# Patient Record
Sex: Female | Born: 1995 | Race: White | Hispanic: No | Marital: Single | Smoking: Never smoker
Health system: Southern US, Community
[De-identification: ages and names within clinical notes are randomized; demographics above are authoritative.]

## PROBLEM LIST (undated history)

## (undated) DIAGNOSIS — G809 Cerebral palsy, unspecified: Secondary | ICD-10-CM

## (undated) DIAGNOSIS — F419 Anxiety disorder, unspecified: Secondary | ICD-10-CM

## (undated) DIAGNOSIS — N946 Dysmenorrhea, unspecified: Secondary | ICD-10-CM

## (undated) DIAGNOSIS — Z87442 Personal history of urinary calculi: Secondary | ICD-10-CM

## (undated) DIAGNOSIS — Z9889 Other specified postprocedural states: Secondary | ICD-10-CM

## (undated) HISTORY — DX: Dysmenorrhea, unspecified: N94.6

## (undated) HISTORY — PX: ADDUCTOR RELEASE: SHX1126

## (undated) HISTORY — PX: OTHER SURGICAL HISTORY: SHX169

## (undated) HISTORY — PX: HAMSTRING LENGTHENING: SHX1722

## (undated) HISTORY — DX: Cerebral palsy, unspecified: G80.9

## (undated) HISTORY — DX: Anxiety disorder, unspecified: F41.9

---
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
## 2017-02-07 ENCOUNTER — Other Ambulatory Visit: Payer: Self-pay | Admitting: Physician Assistant

## 2017-02-07 ENCOUNTER — Ambulatory Visit
Admission: RE | Admit: 2017-02-07 | Discharge: 2017-02-07 | Disposition: A | Discharge status: Home / Self Care | Payer: Medicaid Other | Source: Ambulatory Visit | Attending: Physician Assistant | Admitting: Physician Assistant

## 2017-02-07 DIAGNOSIS — R059 Cough, unspecified: Secondary | ICD-10-CM

## 2017-02-07 DIAGNOSIS — R05 Cough: Secondary | ICD-10-CM

## 2019-03-08 ENCOUNTER — Other Ambulatory Visit: Payer: Self-pay

## 2019-03-08 DIAGNOSIS — Z20822 Contact with and (suspected) exposure to covid-19: Secondary | ICD-10-CM

## 2019-03-10 LAB — NOVEL CORONAVIRUS, NAA: SARS-CoV-2, NAA: NOT DETECTED

## 2019-05-27 ENCOUNTER — Encounter: Payer: Self-pay | Admitting: Physical Medicine and Rehabilitation

## 2019-06-18 ENCOUNTER — Encounter
Payer: Medicaid Other | Attending: Physical Medicine and Rehabilitation | Admitting: Physical Medicine and Rehabilitation

## 2019-06-18 ENCOUNTER — Encounter: Payer: Self-pay | Admitting: Physical Medicine and Rehabilitation

## 2019-06-18 ENCOUNTER — Other Ambulatory Visit: Payer: Self-pay

## 2019-06-18 VITALS — BP 123/84 | HR 100 | Temp 98.1°F | Wt 80.0 lb

## 2019-06-18 DIAGNOSIS — Z993 Dependence on wheelchair: Secondary | ICD-10-CM | POA: Diagnosis present

## 2019-06-18 DIAGNOSIS — M7918 Myalgia, other site: Secondary | ICD-10-CM | POA: Diagnosis present

## 2019-06-18 DIAGNOSIS — G808 Other cerebral palsy: Secondary | ICD-10-CM | POA: Diagnosis present

## 2019-06-18 DIAGNOSIS — M898X1 Other specified disorders of bone, shoulder: Secondary | ICD-10-CM | POA: Insufficient documentation

## 2019-06-18 MED ORDER — DANTROLENE SODIUM 25 MG PO CAPS: 25.0000 mg | ORAL_CAPSULE | Freq: Every day | ORAL | 5 refills | Status: DC

## 2019-06-18 NOTE — Progress Notes (Signed)
Subjective:    Patient ID: Melissa Wagner, female    DOB: [birth year], 24 y.o.   MRN: [id number]  HPI   Patient is a 24 yr old R handed female with CP- spastic diplegia and has generalized anxiety disorder- On Sertraline for anxiety    needs at least 2 people in room to help if needs injections.    Hx of hamstring lengthening at age 9 Dorsal Rhizotomy at age 10.   Dr [facility] at [name]- that how got referral Thinks needs more hamstring tightening, but doesn's have time for casts x 10 weeks.    Pain- primary pain in R shoulder around scapula- constant 8/10 - on bad day- real bad day 10/10; 15/10 when can't get out of bed. Uses R hand to drive, write, and do everything (L hand can grab things, but more dominant R sided, but more spasticity on R) Doesn't trust L hand to not drop things.   Doesn't hurt more with hand on joystick.  Hurts the most when using laptop-  Has back support to keep better posture- to keep shoulder back so shoulder doesn't get so tight Since doesn't have core strength to maintain best posture.  Had since middle school And has chronic Has from muscle tension since elementary school as well.  Couldn't go tennis balls in the right place.  Sometimes pain can be B/L! When bad  Only tried extra tylenol- not sure if does much but puts her to sleep.  Also having issues with adductors B/L- feel stiff- tight. Has tried Lidocaine patches- didn't work.  PCP - 70-80 lbs and 33ft 3inches    Got COVID injection 1st dose Has a deathly bad allergic reaction to BACLOFEN!!!   Fatique Feels like body doesn't relax when she sleeps- holds tension/tightness everywhere.  Tight all the time- goes to bed at night/wakes up in AM and feels like runs marathon.  Feels doesn't get good sleep. Worse by anxiety.   Tried: Melatonin- gives her heartburn; gummies of melatonin and camommile/passflower- works to get her asleep- doesn't keep asleep. Never tried any other  sleep meds GP conservative with meds Never tried any other spasticity agents   Hx of Botox injection in legs- built up tolerance and didn't work anymore.  Stopped wearing AFOs at age 43 since custom AFOs stopped being covered by insurance.  Stopped wearing them because off the shelf AFOs hurt.  Can bathe and dress self, but takes increased time- shower takes 6 transfers.  Social Hx: Is college student  Pain Inventory Average Pain 6 Pain Right Now 8 My pain is constant, burning, aching and tightness  In the last 24 hours, has pain interfered with the following? General activity 7 Relation with others 4 Enjoyment of life 6 What TIME of day is your pain at its worst? daytime Sleep (in general) Fair  Pain is worse with: unsure Pain improves with: rest and medication Relief from Meds: 5  Mobility how many minutes can you walk? unknown ability to climb steps?  no do you drive?  no use a wheelchair transfers alone  Function what is your job? student disabled: date disabled . I need assistance with the following:  meal prep, household duties and shopping  Neuro/Psych bladder control problems bowel control problems anxiety  Prior Studies Any changes since last visit?  no  Physicians involved in your care Any changes since last visit?  no Primary care Melissa Wagner   No family history on file.  Social History   Socioeconomic History  . Marital status: Single    Spouse name: Not on file  . Number of children: Not on file  . Years of education: Not on file  . Highest education level: Not on file  Occupational History  . Not on file  Tobacco Use  . Smoking status: Not on file  Substance and Sexual Activity  . Alcohol use: Not on file  . Drug use: Not on file  . Sexual activity: Not on file  Other Topics Concern  . Not on file  Social History Narrative  . Not on file   Social Determinants of Health   Financial Resource  Strain:   . Difficulty of Paying Living Expenses:   Food Insecurity:   . Worried About Programme researcher, broadcasting/film/video in the Last Year:   . Barista in the Last Year:   Transportation Needs:   . Freight forwarder (Medical):   Marland Kitchen Lack of Transportation (Non-Medical):   Physical Activity:   . Days of Exercise per Week:   . Minutes of Exercise per Session:   Stress:   . Feeling of Stress :   Social Connections:   . Frequency of Communication with Friends and Family:   . Frequency of Social Gatherings with Friends and Family:   . Attends Religious Services:   . Active Member of Clubs or Organizations:   . Attends Banker Meetings:   Marland Kitchen Marital Status:    Past Surgical History:  Procedure Laterality Date  . ADDUCTOR RELEASE     at age 37  . dorsal rhizotomy     at age 30  . HAMSTRING LENGTHENING     at the age 18   Past Medical History:  Diagnosis Date  . Anxiety   . Cerebral palsy (HCC)   . Dysmenorrhea    BP 123/84   Pulse 100   Temp 98.1 F (36.7 C)   Wt 80 lb (36.3 kg) Comment: in wheelchair  SpO2 97%   Opioid Risk Score:   Fall Risk Score:  `1  Depression screen PHQ 2/9  Depression screen PHQ 2/9 06/18/2019  Decreased Interest 0  Down, Depressed, Hopeless 0  PHQ - 2 Score 0  Altered sleeping 3  Tired, decreased energy 2  Change in appetite 0  Feeling bad or failure about yourself  1  Trouble concentrating 2  Moving slowly or fidgety/restless 2  Suicidal thoughts 0  PHQ-9 Score 10  Difficult doing work/chores Somewhat difficult   Review of Systems  Gastrointestinal: Positive for constipation.  Psychiatric/Behavioral: The patient is nervous/anxious.   All other systems reviewed and are negative.      Objective:   Physical Exam  Awake, alert, petite, thin, in power w/c, power w/c, R joystick, NAD  MS:  Can lift shoulders to >90 degrees/flexion Lacking L elbow ROM -5-10 degrees Full ROM of R elbow Hyperextension of MCPs and slightly at  PIP not at DIPs Hyperextend thumbs at well Lacking ~ 35-40 degrees R knee extension and L knee extension lacking 20-30 degrees  Curls toes at rest; 2nd toes pushed down and curls underneath big toe B/L R>>>L rhomboid tightness; radiates up to  Upper trap and pec and scalenes and splenius capitus- so tight difficult to do ROM of R shoulder/scapulae.  MAS of 3 in LEs and 2 in LUE       Assessment & Plan:  Patient is a 24 yr old female with diplegic CP  and generalized anxiety disorder w/c dependent with significant spasticity and R shoulder/scapulae pain and fatigue  1. ALLERGIC TO BACLOFEN  2.  Theracane- [employer]- usually $30- hold firm pressure 2-4 minutes on muscles that are tight- use youtube-theracane for videos how ot use.   3. Wait for 1-2 weeks before starting- Magnesium 400 mg- 1-3x/day - only side effect is looser stools.  Take 1 tab 400 mg x 1 week then 2 tabs/800 mg and can if possible, go to 3 tabs.  4. Start first- Dantrolene- 25 mg nightly x- 1 month;  Then 50 mg nightly x 1 months then 75 mg nightly- for spasticity - can titrate up faster- as fast as every week.    5. In future, will discuss arm bike for exercise.   6. Will continue to increase Dantrolene until we get a good mix of spasticity control but not reducing strength.   7.  Will schedule appointment for trigger point injections.   8. F/U in 1- 2 weeks for injections and will see her in 6 more weeks for f/u. Double visit.   Spent a total of 80 minutes on appointment- pt had appropriately, lots of questions.

## 2019-06-18 NOTE — Patient Instructions (Signed)
Patient is a 24 yr old female with diplegic CP and generalized anxiety disorder w/c dependent with significant spasticity and R shoulder/scapulae pain and fatigue  1. ALLERGIC TO BACLOFEN  2.  Theracane- [employer]- usually $30- hold firm pressure 2-4 minutes on muscles that are tight- use youtube-theracane for videos how ot use.   3. Wait for 1-2 weeks before starting- Magnesium 400 mg- 1-3x/day - only side effect is looser stools.  Take 1 tab 400 mg x 1 week then 2 tabs/800 mg and can if possible, go to 3 tabs.  4. Start first- Dantrolene- 25 mg nightly x- 1 month;  Then 50 mg nightly x 1 months then 75 mg nightly- for spasticity - can titrate up faster- as fast as every week. Will do LFTs/CMP at next visit. Easier to use R outside vein in elbow   5. In future, will discuss arm bike for exercise.   6. Will continue to increase Dantrolene until we get a good mix of spasticity control but not reducing strength.   7.  Will schedule appointment for trigger point injections.   8. F/U in 1- 2 weeks for injections

## 2019-06-30 ENCOUNTER — Telehealth: Payer: Self-pay | Admitting: *Deleted

## 2019-06-30 NOTE — Telephone Encounter (Signed)
Prior authorization submitted for dantrolene  Approved 06/29/2019 - 06/23/2020

## 2019-07-07 ENCOUNTER — Other Ambulatory Visit: Payer: Self-pay

## 2019-07-07 ENCOUNTER — Encounter: Payer: Self-pay | Admitting: Physical Medicine and Rehabilitation

## 2019-07-07 ENCOUNTER — Encounter
Payer: Medicaid Other | Attending: Physical Medicine and Rehabilitation | Admitting: Physical Medicine and Rehabilitation

## 2019-07-07 VITALS — BP 126/86 | HR 98 | Temp 97.5°F

## 2019-07-07 DIAGNOSIS — Z993 Dependence on wheelchair: Secondary | ICD-10-CM | POA: Diagnosis present

## 2019-07-07 DIAGNOSIS — R6884 Jaw pain: Secondary | ICD-10-CM | POA: Diagnosis not present

## 2019-07-07 DIAGNOSIS — M7918 Myalgia, other site: Secondary | ICD-10-CM | POA: Diagnosis present

## 2019-07-07 DIAGNOSIS — M898X1 Other specified disorders of bone, shoulder: Secondary | ICD-10-CM | POA: Insufficient documentation

## 2019-07-07 DIAGNOSIS — G808 Other cerebral palsy: Secondary | ICD-10-CM

## 2019-07-07 NOTE — Progress Notes (Deleted)
Subjective:    Patient ID: Melissa Wagner, female    DOB: 1996/03/04, 24 y.o.   MRN: [id number]  HPI  Pain Inventory Average Pain 6 Pain Right Now 4 My pain is na  In the last 24 hours, has pain interfered with the following? General activity 7 Relation with others 4 Enjoyment of life 7 What TIME of day is your pain at its worst? evening Sleep (in general) Fair  Pain is worse with: unsure Pain improves with: rest and medication Relief from Meds: na  Mobility use a wheelchair transfers alone  Function disabled: date disabled Oct 22, 1995  Neuro/Psych bladder control problems bowel control problems anxiety  Prior Studies Any changes since last visit?  no  Physicians involved in your care Any changes since last visit?  no   Family History  Problem Relation Age of Onset  . Hypertension Mother   . Hyperlipidemia Mother   . Obesity Mother   . Hypothyroidism Mother   . Hypertension Father   . Hyperlipidemia Father    Social History   Socioeconomic History  . Marital status: Single    Spouse name: Not on file  . Number of children: Not on file  . Years of education: Not on file  . Highest education level: Not on file  Occupational History  . Not on file  Tobacco Use  . Smoking status: Never Smoker  . Smokeless tobacco: Never Used  Substance and Sexual Activity  . Alcohol use: Never  . Drug use: Never  . Sexual activity: Not on file  Other Topics Concern  . Not on file  Social History Narrative  . Not on file   Social Determinants of Health   Financial Resource Strain:   . Difficulty of Paying Living Expenses:   Food Insecurity:   . Worried About Programme researcher, broadcasting/film/video in the Last Year:   . Barista in the Last Year:   Transportation Needs:   . Freight forwarder (Medical):   Marland Kitchen Lack of Transportation (Non-Medical):   Physical Activity:   . Days of Exercise per Week:   . Minutes of Exercise per Session:   Stress:   . Feeling of Stress  :   Social Connections:   . Frequency of Communication with Friends and Family:   . Frequency of Social Gatherings with Friends and Family:   . Attends Religious Services:   . Active Member of Clubs or Organizations:   . Attends Banker Meetings:   Marland Kitchen Marital Status:    Past Surgical History:  Procedure Laterality Date  . ADDUCTOR RELEASE     at age 42  . dorsal rhizotomy     at age 17  . HAMSTRING LENGTHENING     at the age 47   Past Medical History:  Diagnosis Date  . Anxiety   . Cerebral palsy (HCC)   . Dysmenorrhea    BP 126/86   Pulse 98   Temp (!) 97.5 F (36.4 C)   SpO2 98%   Opioid Risk Score:   Fall Risk Score:  `1  Depression screen PHQ 2/9  Depression screen PHQ 2/9 06/18/2019  Decreased Interest 0  Down, Depressed, Hopeless 0  PHQ - 2 Score 0  Altered sleeping 3  Tired, decreased energy 2  Change in appetite 0  Feeling bad or failure about yourself  1  Trouble concentrating 2  Moving slowly or fidgety/restless 2  Suicidal thoughts 0  PHQ-9 Score 10  Difficult doing work/chores Somewhat difficult    Review of Systems  Constitutional: Positive for appetite change.       Poor appetite  HENT: Negative.   Eyes: Negative.   Respiratory: Negative.   Cardiovascular: Negative.   Gastrointestinal: Positive for constipation.       Bowel control  Endocrine: Negative.   Genitourinary:       Bladder control  Musculoskeletal: Negative.   Skin: Negative.   Allergic/Immunologic: Negative.   Neurological: Negative.   Hematological: Negative.   Psychiatric/Behavioral: The patient is nervous/anxious.   All other systems reviewed and are negative.      Objective:   Physical Exam        Assessment & Plan:

## 2019-07-07 NOTE — Patient Instructions (Signed)
Patient here for trigger point injections for  Consent done and on chart.  Cleaned areas with alcohol and injected using a 27 gauge 1.5 inch needle  Injected  4.5cc Using 1% Lidocaine with no EPI  Upper traps Levators Posterior scalenes Middle scalenes Splenius Capitus Pectoralis Major Rhomboids On R 4x and ON L, 3 spots Infraspinatus Teres Major/minor Thoracic paraspinals Lumbar paraspinals Other injections-    Patient's level of pain prior was- 4/10 Current level of pain after injections is- upper back is 1-2/10- and can do more R shoulder extension- and low back muscles fatigued from position. And can now bend down further than has before too.  There was no bleeding or complications.  Patient was advised to drink a lot of water on day after injections to flush system Will have increased soreness for 12-48 hours after injections.  Can use Lidocaine patches the day AFTER injections Can use theracane on day of injections in places didn't inject Can use heating pad ice 4-6 hours AFTER injections   2. Will  Show pt how to do mouth release to relax muscles in jaw. Rolls down and up gumline and then hold muscle in and out of mouth for 3-5 minutes until relaxes.    3. Can start Dantrolene once insurance approves- then wait for 2 weeks for magnesium.  4. Asked about the CP fatigue. Discussed spoon theory.   5. Talks about melatonin- likely takes 6 mg- allowed to take up to 9 mg (3 pills/gummies)  6.   F/U 6 weeks

## 2019-07-07 NOTE — Progress Notes (Signed)
  Hasn't started Dantrolene- because pharmacy hasn't filled; insurance hasn't approved yet.   Doesn't need LFTs yet.   Hasn't tried Magnesium yet.  Since was to start Magnesium after dantrolene.   Showers are the most fatiguing thing she does every day.   Discussed spoon theory for energy- since c/o fatigue.  Still not sleeping well.   Describing muscle of L foot- pulling towards her- very painful.    Patient here for trigger point injections for  Consent done and on chart.  Cleaned areas with alcohol and injected using a 27 gauge 1.5 inch needle  Injected  4.5cc Using 1% Lidocaine with no EPI  Upper traps Levators Posterior scalenes Middle scalenes Splenius Capitus Pectoralis Major Rhomboids On R 4x and ON L, 3 spots Infraspinatus Teres Major/minor Thoracic paraspinals Lumbar paraspinals Other injections-    Patient's level of pain prior was- 4/10 Current level of pain after injections is- upper back is 1-2/10- and can do more R shoulder extension- and low back muscles fatigued from position. And can now bend down further than has before too.  There was no bleeding or complications.  Patient was advised to drink a lot of water on day after injections to flush system Will have increased soreness for 12-48 hours after injections.  Can use Lidocaine patches the day AFTER injections Can use theracane on day of injections in places didn't inject Can use heating pad ice 4-6 hours AFTER injections   2.  Showed pt how to do mouth release for jaw pain- to relax muscles in jaw. Rolls down and up gumline and then hold muscle in and out of mouth for 3-5 minutes until relaxes.    3. Can start Dantrolene- then wait for 2 weeks for magnesium.  4. Asked about the CP fatigue. Discussed spoon theory.   5. Talks about melatonin- likely takes 6 mg- allowed to take up to 9 mg (3 pills/gummies)  6.   F/U 6 weeks double appointment

## 2019-07-12 ENCOUNTER — Telehealth: Payer: Self-pay | Admitting: *Deleted

## 2019-07-12 NOTE — Telephone Encounter (Signed)
Ms Tukes called to report that she is able to get the dantrolene now but the directions on the bottle do not match what Dr Berline Chough told her as directions.  Bottle has the titrate up per week and she reports Dr Berline Chough said to take one daily for a month then go to two daily for the month and then three.  Please advise.

## 2019-07-13 NOTE — Telephone Encounter (Signed)
I wrote down for pt how FAST she CAN titrate up, if she feels comfortable and how slow SHE can TITRATE IF not tolerating Dantrolene as easily- I explained this to her, but she appears to not remember.   So, she can titrate up every week if she's tolerating the medicine, OR as SLOW as increase every month, if she's having difficulty getting used to the medicine.   Thanks, ML

## 2019-07-15 NOTE — Telephone Encounter (Signed)
Notified Melissa Wagner. She is not comfortable increasing on weekly basis.  She will increase monthly.

## 2019-07-30 ENCOUNTER — Encounter: Payer: Medicaid Other | Admitting: Physical Medicine and Rehabilitation

## 2019-08-02 ENCOUNTER — Encounter
Payer: Medicaid Other | Attending: Physical Medicine and Rehabilitation | Admitting: Physical Medicine and Rehabilitation

## 2019-08-02 ENCOUNTER — Other Ambulatory Visit: Payer: Self-pay

## 2019-08-02 ENCOUNTER — Encounter: Payer: Self-pay | Admitting: Physical Medicine and Rehabilitation

## 2019-08-02 VITALS — BP 129/86 | HR 106

## 2019-08-02 DIAGNOSIS — G808 Other cerebral palsy: Secondary | ICD-10-CM | POA: Diagnosis not present

## 2019-08-02 DIAGNOSIS — M898X1 Other specified disorders of bone, shoulder: Secondary | ICD-10-CM | POA: Insufficient documentation

## 2019-08-02 DIAGNOSIS — M7918 Myalgia, other site: Secondary | ICD-10-CM | POA: Diagnosis not present

## 2019-08-02 DIAGNOSIS — Z993 Dependence on wheelchair: Secondary | ICD-10-CM | POA: Diagnosis not present

## 2019-08-02 NOTE — Progress Notes (Signed)
Patient is a 24 yr old female with diplegic CP and generalized anxiety disorder w/c dependent with significant spasticity and R shoulder/scapulae pain and fatigue  Is on Dantrolene 50 mg QHS moved up to after 1 week on Dantrolene 25 mg QHS. Tolerating - no side effects. Harder than normal to get up in the morning.    Don't break a sweat on R foot -easier-  Toes are starting to be less tight   Here for trigger point injections R rhomboids- feels fatigued, after injection last time.  Doesn't know if they were helpful For 24 hours- great ROM Then had HA's afterward Then muscles clamped even tighter.   Only had to wear back brace once since last time.  Anxiety going into freak out mode.  Probably had some improvement, since willing to do again.    Plan: 1. Be concerned if pulse >125-130.  2. Can increase Dantrolene to 75 mg nightly.   3. Might have to learn how to do tasks differently with less tone.   4. Patient here for trigger point injections for myofascial pain/trigger points   Consent done and on chart.  Cleaned areas with alcohol and injected using a 27 gauge 1.5 inch needle  Injected 2cc Using 1% Lidocaine with no EPI  Upper traps Levators Posterior scalenes Middle scalenes Splenius Capitus B/L Pectoralis Major Rhomboids B/L x2 Infraspinatus Teres Major/minor Thoracic paraspinals B/L x2 Lumbar paraspinals Other injections-    Patient's level of pain prior was- 6/10 Current level of pain after injections is- same-   There was no bleeding or complications.  Patient was advised to drink a lot of water on day after injections to flush system Will have increased soreness for 12-48 hours after injections.  Can use Lidocaine patches the day AFTER injections Can use theracane on day of injections in places didn't inject Can use heating pad 4-6 hours AFTER injections  5. Pain diary- for next few weeks.  Not lazy by laying down multiple times per day.      6. F/U- 6 weeks.

## 2019-08-02 NOTE — Patient Instructions (Signed)
Plan: 1. Be concerned if pulse >125-130.  2. Can increase Dantrolene to 75 mg nightly.   3. Might have to learn how to do tasks differently with less tone.   4. Patient here for trigger point injections for myofascial pain/trigger points   Consent done and on chart.  Cleaned areas with alcohol and injected using a 27 gauge 1.5 inch needle  Injected 2cc Using 1% Lidocaine with no EPI  Upper traps Levators Posterior scalenes Middle scalenes Splenius Capitus B/L Pectoralis Major Rhomboids B/L x2 Infraspinatus Teres Major/minor Thoracic paraspinals B/L x2 Lumbar paraspinals Other injections-    Patient's level of pain prior was- 6/10 Current level of pain after injections is- same-   There was no bleeding or complications.  Patient was advised to drink a lot of water on day after injections to flush system Will have increased soreness for 12-48 hours after injections.  Can use Lidocaine patches the day AFTER injections Can use theracane on day of injections in places didn't inject Can use heating pad 4-6 hours AFTER injections  5. Pain diary- for next few weeks.  Not lazy by laying down multiple times per day.     6. F/U- 6 weeks.

## 2019-08-10 DIAGNOSIS — G800 Spastic quadriplegic cerebral palsy: Secondary | ICD-10-CM

## 2019-08-17 ENCOUNTER — Telehealth: Payer: Self-pay

## 2019-08-17 MED ORDER — DANTROLENE SODIUM 25 MG PO CAPS: 25.0000 mg | ORAL_CAPSULE | Freq: Every day | ORAL | 5 refills | Status: DC

## 2019-08-17 NOTE — Telephone Encounter (Signed)
Patient called stating she only has 4 days left of her Dantrolene and she called the pharmacy but they said to call Dr. Berline Chough office. In reviewing her chart. The first prescription was a titrate up. I called and gave [facility] a new script with new directions.

## 2019-09-13 ENCOUNTER — Encounter
Payer: Medicaid Other | Attending: Physical Medicine and Rehabilitation | Admitting: Physical Medicine and Rehabilitation

## 2019-09-13 ENCOUNTER — Encounter: Payer: Self-pay | Admitting: Physical Medicine and Rehabilitation

## 2019-09-13 ENCOUNTER — Other Ambulatory Visit: Payer: Self-pay

## 2019-09-13 VITALS — BP 132/86 | HR 88

## 2019-09-13 DIAGNOSIS — M898X1 Other specified disorders of bone, shoulder: Secondary | ICD-10-CM | POA: Diagnosis not present

## 2019-09-13 DIAGNOSIS — G808 Other cerebral palsy: Secondary | ICD-10-CM | POA: Diagnosis not present

## 2019-09-13 DIAGNOSIS — Z993 Dependence on wheelchair: Secondary | ICD-10-CM | POA: Insufficient documentation

## 2019-09-13 DIAGNOSIS — M7918 Myalgia, other site: Secondary | ICD-10-CM | POA: Diagnosis present

## 2019-09-13 DIAGNOSIS — R252 Cramp and spasm: Secondary | ICD-10-CM | POA: Insufficient documentation

## 2019-09-13 MED ORDER — DANTROLENE SODIUM 100 MG PO CAPS: 100.0000 mg | ORAL_CAPSULE | Freq: Every day | ORAL | 5 refills | Status: DC

## 2019-09-13 NOTE — Progress Notes (Signed)
Subjective:    Patient ID: Melissa Wagner, female    DOB: 1995/07/25, 24 y.o.   MRN: [id number]  HPI  Patient is a 24 yr old R handed female with CP- spastic diplegia and has generalized anxiety disorder- On Sertraline for anxiety  Can actually see toes -all 5 toes now- first time in years.    Has transferred home out of dorm Has more hamstring ROM than before.    Spasticity On 75 mg of Dantrolene- helps with sleep-  Sleeping 5-6 hours consecutively.    2 Concerns Gained ~ 10-15 lbs Since January Never been able to gain weight - ever Can still transfer self- no issues.  Thinks a lot of muscle Strength has gotten better.  Balance feels a little off.  .  Pain diary didn't go well Semester ended 3 days after I saw her last.   Trigger point injections On back of neck were fantastic And R rhomboids used to be like a [place] breast, was so swollen- now better.   Small of back "wringing them out like a wet washcloth" ~ 4x/week on average.  When sitting with hips forward.    Before when had hamstring surgery- was open- needed to move foot backwards- was able to then, not able to now-  Was wondering if should have the closed procedure again.    Hasn't changed diet and having more constipation.        Pain Inventory Average Pain 5 Pain Right Now 5 My pain is constant  In the last 24 hours, has pain interfered with the following? General activity n/a Relation with others n/a Enjoyment of life n/a What TIME of day is your pain at its worst? anytime Sleep (in general) Fair  Pain is worse with: unsure Pain improves with: injections Relief from Meds: no pain meds  Mobility use a wheelchair transfers alone  Function not employed: date last employed .  Neuro/Psych tremor anxiety  Prior Studies Any changes since last visit?  no  Physicians involved in your care Any changes since last visit?  no   Family History  Problem Relation Age of Onset  .  Hypertension Mother   . Hyperlipidemia Mother   . Obesity Mother   . Hypothyroidism Mother   . Hypertension Father   . Hyperlipidemia Father    Social History   Socioeconomic History  . Marital status: Single    Spouse name: Not on file  . Number of children: Not on file  . Years of education: Not on file  . Highest education level: Not on file  Occupational History  . Not on file  Tobacco Use  . Smoking status: Never Smoker  . Smokeless tobacco: Never Used  Vaping Use  . Vaping Use: Never used  Substance and Sexual Activity  . Alcohol use: Never  . Drug use: Never  . Sexual activity: Not on file  Other Topics Concern  . Not on file  Social History Narrative  . Not on file   Social Determinants of Health   Financial Resource Strain:   . Difficulty of Paying Living Expenses:   Food Insecurity:   . Worried About Charity fundraiser in the Last Year:   . Arboriculturist in the Last Year:   Transportation Needs:   . Film/video editor (Medical):   Marland Kitchen Lack of Transportation (Non-Medical):   Physical Activity:   . Days of Exercise per Week:   . Minutes of Exercise per Session:  Stress:   . Feeling of Stress :   Social Connections:   . Frequency of Communication with Friends and Family:   . Frequency of Social Gatherings with Friends and Family:   . Attends Religious Services:   . Active Member of Clubs or Organizations:   . Attends Banker Meetings:   Marland Kitchen Marital Status:    Past Surgical History:  Procedure Laterality Date  . ADDUCTOR RELEASE     at age 69  . dorsal rhizotomy     at age 68  . HAMSTRING LENGTHENING     at the age 63   Past Medical History:  Diagnosis Date  . Anxiety   . Cerebral palsy (HCC)   . Dysmenorrhea    BP 132/86   Pulse 88   SpO2 99%   Opioid Risk Score:   Fall Risk Score:  `1  Depression screen PHQ 2/9  Depression screen PHQ 2/9 06/18/2019  Decreased Interest 0  Down, Depressed, Hopeless 0  PHQ - 2 Score 0    Altered sleeping 3  Tired, decreased energy 2  Change in appetite 0  Feeling bad or failure about yourself  1  Trouble concentrating 2  Moving slowly or fidgety/restless 2  Suicidal thoughts 0  PHQ-9 Score 10  Difficult doing work/chores Somewhat difficult    Review of Systems  Constitutional: Negative.   HENT: Negative.   Eyes: Negative.   Respiratory: Negative.   Cardiovascular: Negative.   Gastrointestinal: Negative.   Endocrine: Negative.   Genitourinary: Negative.   Musculoskeletal: Negative.   Skin: Negative.   Allergic/Immunologic: Negative.   Neurological: Positive for tremors.  Psychiatric/Behavioral: The patient is nervous/anxious.   All other systems reviewed and are negative.      Objective:   Physical Exam  Pt sitting up in power w/c, joystick on R side; feet more settled on footplates  Able to extend knees better- L knee lacking only 15-20- was 30 degrees- and R knee ~ 20-25 degrees- was 40 degrees MAS of LEs is 2 today B/L     Assessment & Plan:   Patient is a 24 yr old R handed female with CP- spastic diplegia and has generalized anxiety disorder- On Sertraline for anxiety  1. Asked her to call [employer] about corset brace again,  to work on back pain and sitting upright.   2. Is getting good ROM of hamstrings per mother- and think we should focus on maximizing Dantrolene before look at surgery- can look at [facility] in future.   3. ALLERGIC TO BACLOFEN  4. Dantrolene- 100 mg nightly- call me/change to 50 mg 2x/day IF any concerns.   5. Can add magnesium if you want AFTER been on Dantrolene 100 mg for 1 week- suggest adding mainly for looser stools.   6. Patient here for trigger point injections for spasticity/myofascial pain  Consent done and on chart.  Cleaned areas with alcohol and injected using a 27 gauge 1.5 inch needle  Injected 2cc Using 1% Lidocaine with no EPI  Upper traps Levators Posterior scalenes Middle  scalenes Splenius Capitus Pectoralis Major Rhomboids- R rhomboids x 4- over large muscle spasm Infraspinatus Teres Major/minor Thoracic paraspinals Lumbar paraspinals Other injections- waited on upper traps-    Patient's level of pain prior was 4-6/10 Current level of pain after injections is 2/10  There was no bleeding or complications.  Patient was advised to drink a lot of water on day after injections to flush system Will have  increased soreness for 12-48 hours after injections.  Can use Lidocaine patches the day AFTER injections Can use theracane on day of injections in places didn't inject Can use heating pad 4-6 hours AFTER injections  7. F/U as already scheduled.  8. LFTs/CMP due to spasticity and on Dantrolene   I spent a total of 45 minutes on appointment- as detailed above.

## 2019-09-13 NOTE — Patient Instructions (Signed)
Patient is a 24 yr old R handed female with CP- spastic diplegia and has generalized anxiety disorder- On Sertraline for anxiety  1. Asked her to call [employer] about corset brace again,  to work on back pain and sitting upright.   2. Is getting good ROM of hamstrings per mother- and think we should focus on maximizing Dantrolene before look at surgery- can look at [facility] in future.   3. ALLERGIC TO BACLOFEN  4. Dantrolene- 100 mg nightly- call me/change to 50 mg 2x/day IF any concerns.   5. Can add magnesium if you want AFTER been on Dantrolene 100 mg for 1 week- suggest adding mainly for looser stools.   6. Patient here for trigger point injections for spasticity/myofascial pain  Consent done and on chart.  Cleaned areas with alcohol and injected using a 27 gauge 1.5 inch needle  Injected 2cc Using 1% Lidocaine with no EPI  Upper traps Levators Posterior scalenes Middle scalenes Splenius Capitus Pectoralis Major Rhomboids- R rhomboids x 4- over large muscle spasm Infraspinatus Teres Major/minor Thoracic paraspinals Lumbar paraspinals Other injections- waited on upper traps-    Patient's level of pain prior was 4-6/10 Current level of pain after injections is 2/10  There was no bleeding or complications.  Patient was advised to drink a lot of water on day after injections to flush system Will have increased soreness for 12-48 hours after injections.  Can use Lidocaine patches the day AFTER injections Can use theracane on day of injections in places didn't inject Can use heating pad 4-6 hours AFTER injections  7. F/U as already scheduled.  8. LFTs/CMP due to spasticity and on Dantrolene

## 2019-09-16 ENCOUNTER — Telehealth: Payer: Self-pay

## 2019-09-16 NOTE — Telephone Encounter (Addendum)
Prior authorization submitted via [place] [employer].  Approved 09/17/2019-09/12/2019  Patient notified

## 2019-09-21 ENCOUNTER — Telehealth: Payer: Self-pay

## 2019-09-21 NOTE — Telephone Encounter (Signed)
Last night Melissa Wagner missed her dose of Rx Dantrium 100 mg.   Should she take it now or wait until bedtime to take the medication?   Please advise.

## 2019-09-21 NOTE — Telephone Encounter (Signed)
Patient advise.  

## 2019-09-21 NOTE — Telephone Encounter (Signed)
Take tonight- and just chalk it up to missing one, unfortunately- if it's within 12 hours of scheduled dose, don't take- if it's only a few hours late, no problem.   Thanks, ML

## 2019-10-11 ENCOUNTER — Other Ambulatory Visit: Payer: Self-pay

## 2019-10-11 ENCOUNTER — Encounter
Payer: Medicaid Other | Attending: Physical Medicine and Rehabilitation | Admitting: Physical Medicine and Rehabilitation

## 2019-10-11 ENCOUNTER — Encounter: Payer: Self-pay | Admitting: Physical Medicine and Rehabilitation

## 2019-10-11 VITALS — BP 126/70 | HR 99 | Temp 98.9°F

## 2019-10-11 DIAGNOSIS — G808 Other cerebral palsy: Secondary | ICD-10-CM | POA: Diagnosis not present

## 2019-10-11 DIAGNOSIS — M7918 Myalgia, other site: Secondary | ICD-10-CM | POA: Insufficient documentation

## 2019-10-11 DIAGNOSIS — Z993 Dependence on wheelchair: Secondary | ICD-10-CM | POA: Insufficient documentation

## 2019-10-11 DIAGNOSIS — M898X1 Other specified disorders of bone, shoulder: Secondary | ICD-10-CM | POA: Insufficient documentation

## 2019-10-11 DIAGNOSIS — R252 Cramp and spasm: Secondary | ICD-10-CM | POA: Diagnosis not present

## 2019-10-11 MED ORDER — DANTROLENE SODIUM 50 MG PO CAPS: 150.0000 mg | ORAL_CAPSULE | Freq: Every day | ORAL | 5 refills | Status: DC

## 2019-10-11 NOTE — Patient Instructions (Signed)
Patient is a 24 yr old R handed female with CP- spastic diplegia and has generalized anxiety disorder- On Sertraline for anxiety here for f/u and trigger point injections.    1. Will wait to call in increase in Dantrolene- til Rx is ready to be changed- will increase to Dantrolene 150 mg nightly.  Also got LFTs this AM- will verify looks good.  Will send in, but cannot get til end of month.   2. Will call [employer] for pt-    3. Patient here for trigger point injections for  Consent done and on chart.  Cleaned areas with alcohol and injected using a 27 gauge 1.5 inch needle  Injected  3cc Using 1% Lidocaine with no EPI  Upper traps Levators Posterior scalenes R only Middle scalenes Splenius Capitus B/L Pectoralis Major Rhomboids On R rhomboid, did 4 injections Infraspinatus Teres Major/minor Thoracic paraspinals Lumbar paraspinals Other injections-    Patient's level of pain prior was 6/10 Current level of pain after injections is 3/10- with less muscle tightness on R rhomboids.   There was no bleeding or complications.  Patient was advised to drink a lot of water on day after injections to flush system Will have increased soreness for 12-48 hours after injections.  Can use Lidocaine patches the day AFTER injections Can use theracane on day of injections in places didn't inject Can use heating pad 4-6 hours AFTER injections  4. F/U in 4-6 weeks.

## 2019-10-11 NOTE — Progress Notes (Signed)
Subjective:    Patient ID: Melissa Wagner, female    DOB: [birth year], 24 y.o.   MRN: [id number]  HPI  Patient is a 24 yr old R handed female with CP- spastic diplegia and has generalized anxiety disorder- On Sertraline for anxiety here for f/u and trigger point injections.    Has tried to call [employer]- can't get a human- hasn't gotten a call back.   Went up on Dantrolene- not sure much change with 75 to 100 mg- NO SIDE EFFECTS  Has been so spastic and unmedicated her entire life.   Depends on the day if pain is better- the muscle in upper back doesn't get as tight anymore, but still gets some pain some days.   Knows has gotten more ROM with Dantrolene.   R adductor is really tight last few days- does this every now and then- doesn't think is due to growth spurts, which did when younger. Since 24 yrs old    Pain Inventory Average Pain 6 Pain Right Now 7 My pain is constant and burning  In the last 24 hours, has pain interfered with the following? General activity 7 Relation with others 0 Enjoyment of life 7 What TIME of day is your pain at its worst? varies Sleep (in general) Fair  Pain is worse with: unsure Pain improves with: injections Relief from Meds: no pain meds  Mobility use a wheelchair transfers alone  Function not employed: date last employed .  Neuro/Psych tremor anxiety  Prior Studies Any changes since last visit?  no  Physicians involved in your care Any changes since last visit?  no   Family History  Problem Relation Age of Onset  . Hypertension Mother   . Hyperlipidemia Mother   . Obesity Mother   . Hypothyroidism Mother   . Hypertension Father   . Hyperlipidemia Father    Social History   Socioeconomic History  . Marital status: Single    Spouse name: Not on file  . Number of children: Not on file  . Years of education: Not on file  . Highest education level: Not on file  Occupational History  . Not on file  Tobacco Use    . Smoking status: Never Smoker  . Smokeless tobacco: Never Used  Vaping Use  . Vaping Use: Never used  Substance and Sexual Activity  . Alcohol use: Never  . Drug use: Never  . Sexual activity: Not on file  Other Topics Concern  . Not on file  Social History Narrative  . Not on file   Social Determinants of Health   Financial Resource Strain:   . Difficulty of Paying Living Expenses:   Food Insecurity:   . Worried About Programme researcher, broadcasting/film/video in the Last Year:   . Barista in the Last Year:   Transportation Needs:   . Freight forwarder (Medical):   Marland Kitchen Lack of Transportation (Non-Medical):   Physical Activity:   . Days of Exercise per Week:   . Minutes of Exercise per Session:   Stress:   . Feeling of Stress :   Social Connections:   . Frequency of Communication with Friends and Family:   . Frequency of Social Gatherings with Friends and Family:   . Attends Religious Services:   . Active Member of Clubs or Organizations:   . Attends Banker Meetings:   Marland Kitchen Marital Status:    Past Surgical History:  Procedure Laterality Date  . ADDUCTOR  RELEASE     at age 22  . dorsal rhizotomy     at age 64  . HAMSTRING LENGTHENING     at the age 65   Past Medical History:  Diagnosis Date  . Anxiety   . Cerebral palsy (HCC)   . Dysmenorrhea    BP 126/70   Pulse 99   Temp 98.9 F (37.2 C)   SpO2 97%   Opioid Risk Score:   Fall Risk Score:  `1  Depression screen PHQ 2/9  Depression screen PHQ 2/9 06/18/2019  Decreased Interest 0  Down, Depressed, Hopeless 0  PHQ - 2 Score 0  Altered sleeping 3  Tired, decreased energy 2  Change in appetite 0  Feeling bad or failure about yourself  1  Trouble concentrating 2  Moving slowly or fidgety/restless 2  Suicidal thoughts 0  PHQ-9 Score 10  Difficult doing work/chores Somewhat difficult    Review of Systems  Neurological: Positive for tremors.  Psychiatric/Behavioral: The patient is nervous/anxious.    All other systems reviewed and are negative.      Objective:   Physical Exam Awake, alert, appropriate, in MANUAL w/c today since couldn't get here in power w/c - would have to come at 8am per bus., NAD Very tight in R rhomboids and infrascapular- - actually protruding out some, so tight.  MAS is 2-3 in hips esp R adductor MAS is 2-3 in knees- lacking  20-30 degrees on L; and ~ 30-45 degrees on R of knees.       Assessment & Plan:   Patient is a 24 yr old R handed female with CP- spastic diplegia and has generalized anxiety disorder- On Sertraline for anxiety here for f/u and trigger point injections.    1. Will wait to call in increase in Dantrolene- til Rx is ready to be changed- will increase to Dantrolene 150 mg nightly.  Also got LFTs this AM- will verify looks good.  Will send in, but cannot get til end of month.   2. Will call [employer] for pt-    3. Patient here for trigger point injections for  Consent done and on chart.  Cleaned areas with alcohol and injected using a 27 gauge 1.5 inch needle  Injected  3cc Using 1% Lidocaine with no EPI  Upper traps Levators Posterior scalenes R only Middle scalenes Splenius Capitus B/L Pectoralis Major Rhomboids On R rhomboid, did 4 injections Infraspinatus Teres Major/minor Thoracic paraspinals Lumbar paraspinals Other injections-    Patient's level of pain prior was 6/10 Current level of pain after injections is 3/10- with less muscle tightness on R rhomboids.   There was no bleeding or complications.  Patient was advised to drink a lot of water on day after injections to flush system Will have increased soreness for 12-48 hours after injections.  Can use Lidocaine patches the day AFTER injections Can use theracane on day of injections in places didn't inject Can use heating pad 4-6 hours AFTER injections  4. F/U in 4-6 weeks.   I spent a total of 30 minutes on visit- as detailed above.

## 2019-10-12 LAB — COMPREHENSIVE METABOLIC PANEL
ALT: 4 IU/L (ref 0–32)
AST: 8 IU/L (ref 0–40)
Albumin/Globulin Ratio: 1.6 (ref 1.2–2.2)
Albumin: 4.3 g/dL (ref 3.9–5.0)
Alkaline Phosphatase: 45 IU/L — ABNORMAL LOW (ref 48–121)
BUN/Creatinine Ratio: 11 (ref 9–23)
BUN: 7 mg/dL (ref 6–20)
Bilirubin Total: 0.2 mg/dL (ref 0.0–1.2)
CO2: 20 mmol/L (ref 20–29)
Calcium: 9.4 mg/dL (ref 8.7–10.2)
Chloride: 102 mmol/L (ref 96–106)
Creatinine, Ser: 0.61 mg/dL (ref 0.57–1.00)
GFR calc Af Amer: 149 mL/min/{1.73_m2} (ref >59)
GFR calc non Af Amer: 129 mL/min/{1.73_m2} (ref >59)
Globulin, Total: 2.7 g/dL (ref 1.5–4.5)
Glucose: 104 mg/dL — ABNORMAL HIGH (ref 65–99)
Potassium: 4.2 mmol/L (ref 3.5–5.2)
Sodium: 137 mmol/L (ref 134–144)
Total Protein: 7 g/dL (ref 6.0–8.5)

## 2019-10-13 ENCOUNTER — Telehealth: Payer: Self-pay | Admitting: *Deleted

## 2019-10-13 NOTE — Telephone Encounter (Signed)
Melissa Wagner called and is asking if it is ok to take her dantrolne? She was to have liver function labs. Her labs are resulted.

## 2019-10-13 NOTE — Telephone Encounter (Signed)
Yes everything looks good.

## 2019-10-14 NOTE — Telephone Encounter (Signed)
[place] notified.

## 2019-11-24 ENCOUNTER — Encounter: Payer: Medicaid Other | Admitting: Physical Medicine and Rehabilitation

## 2019-11-26 ENCOUNTER — Encounter
Payer: Medicaid Other | Attending: Physical Medicine and Rehabilitation | Admitting: Physical Medicine and Rehabilitation

## 2019-11-26 ENCOUNTER — Encounter: Payer: Self-pay | Admitting: Physical Medicine and Rehabilitation

## 2019-11-26 ENCOUNTER — Other Ambulatory Visit: Payer: Self-pay

## 2019-11-26 VITALS — BP 132/85 | HR 88 | Temp 98.5°F

## 2019-11-26 DIAGNOSIS — M7918 Myalgia, other site: Secondary | ICD-10-CM | POA: Insufficient documentation

## 2019-11-26 DIAGNOSIS — Z993 Dependence on wheelchair: Secondary | ICD-10-CM | POA: Insufficient documentation

## 2019-11-26 DIAGNOSIS — R252 Cramp and spasm: Secondary | ICD-10-CM | POA: Insufficient documentation

## 2019-11-26 DIAGNOSIS — G808 Other cerebral palsy: Secondary | ICD-10-CM | POA: Diagnosis present

## 2019-11-26 DIAGNOSIS — M898X1 Other specified disorders of bone, shoulder: Secondary | ICD-10-CM | POA: Diagnosis present

## 2019-11-26 MED ORDER — DANTROLENE SODIUM 50 MG PO CAPS: 150.0000 mg | ORAL_CAPSULE | Freq: Every day | ORAL | 5 refills | Status: DC

## 2019-11-26 NOTE — Patient Instructions (Signed)
Plan: 1. Will call in Dantrolene 150 mg nightly- for spasticity.   2.  No issues with around the month birth control issues.   3. Trigger point injections-  Patient here for trigger point injections for  Consent done and on chart.  Cleaned areas with alcohol and injected using a 27 gauge 1.5 inch needle  Injected  3cc Using 1% Lidocaine with no EPI  Upper traps R only Levators R only Posterior scalenes R only Middle scalenes Splenius Capitus Pectoralis Major Rhomboids- R only x3- with improved muscle tightness Infraspinatus Teres Major/minor Thoracic paraspinals Lumbar paraspinals Other injections-    Patient's level of pain prior was 6/10 Current level of pain after injections is 2/10  There was no bleeding or complications.  Patient was advised to drink a lot of water on day after injections to flush system Will have increased soreness for 12-48 hours after injections.  Can use Lidocaine patches the day AFTER injections Can use theracane on day of injections in places didn't inject Can use heating pad 4-6 hours AFTER injections  4. Can have Urinary urgency can signal a UTI, so suggest if it doesn't improve in 1 week, I'd get checked for UTI.   5. If it's not a UTI - then could be caffeine intake which causes bladder irritability.  6.  Having more migraines- 4-5x/month (usually around period) having more migraines- - if gets on continuous birth control and migraines get better, won't send to Neurology- if if doesn't get better, WILL SEND TO Neurology.    7.  F/U 1 month

## 2019-11-26 NOTE — Progress Notes (Signed)
  Patient is a 24 yr old R handed female with CP- spastic diplegia and has generalized anxiety disorder- On Sertraline for anxiety here for f/u and trigger point injections.      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   Classes started last week. Doing developmental Pathology.   Has refilled Dantrolene 100 mg, but never got script for 150 mg daily.   Forgot to call in to [place] that Dantrolene didn't go through.   Nothing else going on.  Tried to up her dose of Sertraline- lasted 5 days- had a lot of increase in side effects, so went back to previous dose.   Fine ever since.  Increase in anxiety around cycle-  Can interact with Dantrolene, so cannot start Hydroxyzine.    Having new "neurological Sx's lately".  - ringing in ears-  Increased with bladder urgency-  Has to go THEN- gotta go, gotta go.  Can get to bathroom, but has some accidents- voiding mid transfer.  Drinking the same amount to slightly more.   PCP wouldn't give her Excedrin to help with migraines since is 80 lbs.        Plan: 1. Will call in Dantrolene 150 mg nightly- for spasticity.   2.  No issues with around the month birth control issues.   3. Trigger point injections-  Patient here for trigger point injections for  Consent done and on chart.  Cleaned areas with alcohol and injected using a 27 gauge 1.5 inch needle  Injected  3cc Using 1% Lidocaine with no EPI  Upper traps R only Levators R only Posterior scalenes R only Middle scalenes Splenius Capitus Pectoralis Major Rhomboids- R only x3- with improved muscle tightness Infraspinatus Teres Major/minor Thoracic paraspinals Lumbar paraspinals Other injections-    Patient's level of pain prior was 6/10 Current level of pain after injections is 2/10  There was no bleeding or complications.  Patient was advised to drink a lot of water on day after injections to flush system Will have increased soreness for  12-48 hours after injections.  Can use Lidocaine patches the day AFTER injections Can use theracane on day of injections in places didn't inject Can use heating pad 4-6 hours AFTER injections  4. Can have Urinary urgency can signal a UTI, so suggest if it doesn't improve in 1 week, I'd get checked for UTI.   5. If it's not a UTI - then could be caffeine intake which causes bladder irritability.  6.  Having more migraines- 4-5x/month (usually around period) having more migraines- - if gets on continuous birth control and migraines get better, won't send to Neurology- if if doesn't get better, WILL SEND TO Neurology.    7.  F/U 1 month   I spent a total of 25 minutes on visit- as detailed above.

## 2019-12-27 ENCOUNTER — Encounter
Payer: Medicaid Other | Attending: Physical Medicine and Rehabilitation | Admitting: Physical Medicine and Rehabilitation

## 2019-12-27 ENCOUNTER — Encounter: Payer: Self-pay | Admitting: Physical Medicine and Rehabilitation

## 2019-12-27 ENCOUNTER — Other Ambulatory Visit: Payer: Self-pay

## 2019-12-27 VITALS — BP 123/79 | HR 96 | Temp 98.6°F | Ht <= 58 in | Wt 85.0 lb

## 2019-12-27 DIAGNOSIS — M7918 Myalgia, other site: Secondary | ICD-10-CM | POA: Diagnosis present

## 2019-12-27 DIAGNOSIS — M898X1 Other specified disorders of bone, shoulder: Secondary | ICD-10-CM | POA: Insufficient documentation

## 2019-12-27 DIAGNOSIS — Z993 Dependence on wheelchair: Secondary | ICD-10-CM | POA: Insufficient documentation

## 2019-12-27 DIAGNOSIS — R252 Cramp and spasm: Secondary | ICD-10-CM | POA: Insufficient documentation

## 2019-12-27 DIAGNOSIS — G808 Other cerebral palsy: Secondary | ICD-10-CM | POA: Diagnosis not present

## 2019-12-27 NOTE — Progress Notes (Signed)
Patient is a 24 yr old R handed female with CP- spastic diplegia and has generalized anxiety disorder- On Sertraline for anxietyhere for f/u and trigger point injections.    In manual w/c- since had to have mother bring her to  appointment-   Injections lasting almost 1 month.  A little more tightness around 3-4 weeks- instead of 6 weeks.   Micah Flesher out of town this weekend- in manual w/c- not as conducive for pain.   Medicaid has denied Dantrolene.  "working on that'. We got form and are working on it.    Bladder issue- was not a UTI.   2 full blown Migraines this month. 1 right after last trigger point session- started Saturday night after injections.   Got tragus pierced- has been helpful.     Plan: 1. Patient here for trigger point injections for  Consent done and on chart.  Cleaned areas with alcohol and injected using a 27 gauge 1.5 inch needle  Injected 4.5cc Using 1% Lidocaine with no EPI  Upper traps R only Levators Posterior scalenes- pt declined (caused migraine) Middle scalenes Splenius Capitus b/L Pectoralis Major Rhomboids R rhomboids x5 Infraspinatus Teres Major/minor Thoracic paraspinals Lumbar paraspinals Other injections-    Patient's level of pain prior was- 7/10 Current level of pain after injections is- 4/10- and better ROM/less rice krispy, per pt.   There was no bleeding or complications.  Patient was advised to drink a lot of water on day after injections to flush system Will have increased soreness for 12-48 hours after injections.  Can use Lidocaine patches the day AFTER injections Can use theracane on day of injections in places didn't inject Can use heating pad 4-6 hours AFTER injections  2. If has difficulties with increase in Dantrolene, will go back to previous dose.   3. If no preauthorization is back by tomorrow, then will send in 7 days supply of Dantrolene 100 mg  Daily.   4. Dantrolene doing pre-ath for 150 mg/day.   5.  F/U in 4 weeks-    I spent a total of 30 minutes on on visit- as detailed above.

## 2019-12-27 NOTE — Patient Instructions (Signed)
  Plan: 1. Patient here for trigger point injections for  Consent done and on chart.  Cleaned areas with alcohol and injected using a 27 gauge 1.5 inch needle  Injected 4.5cc Using 1% Lidocaine with no EPI  Upper traps R only Levators Posterior scalenes- pt declined (caused migraine) Middle scalenes Splenius Capitus b/L Pectoralis Major Rhomboids R rhomboids x5 Infraspinatus Teres Major/minor Thoracic paraspinals Lumbar paraspinals Other injections-    Patient's level of pain prior was- 7/10 Current level of pain after injections is- 4/10- and better ROM/less rice krispy, per pt.   There was no bleeding or complications.  Patient was advised to drink a lot of water on day after injections to flush system Will have increased soreness for 12-48 hours after injections.  Can use Lidocaine patches the day AFTER injections Can use theracane on day of injections in places didn't inject Can use heating pad 4-6 hours AFTER injections  2. If has difficulties with increase in Dantrolene, will go back to previous dose.   3. If no preauthorization is back by tomorrow, then will send in 7 days supply of Dantrolene 100 mg  Daily.   4. Dantrolene doing pre-ath for 150 mg/day.   5. F/U in 4 weeks-

## 2020-01-31 ENCOUNTER — Other Ambulatory Visit: Payer: Self-pay

## 2020-01-31 ENCOUNTER — Encounter
Payer: Medicaid Other | Attending: Physical Medicine and Rehabilitation | Admitting: Physical Medicine and Rehabilitation

## 2020-01-31 ENCOUNTER — Encounter: Payer: Self-pay | Admitting: Physical Medicine and Rehabilitation

## 2020-01-31 VITALS — BP 135/82 | HR 91 | Temp 98.0°F | Ht <= 58 in | Wt 85.0 lb

## 2020-01-31 DIAGNOSIS — M898X1 Other specified disorders of bone, shoulder: Secondary | ICD-10-CM | POA: Diagnosis not present

## 2020-01-31 DIAGNOSIS — G808 Other cerebral palsy: Secondary | ICD-10-CM | POA: Insufficient documentation

## 2020-01-31 DIAGNOSIS — R252 Cramp and spasm: Secondary | ICD-10-CM

## 2020-01-31 DIAGNOSIS — M7918 Myalgia, other site: Secondary | ICD-10-CM | POA: Diagnosis not present

## 2020-01-31 DIAGNOSIS — Z993 Dependence on wheelchair: Secondary | ICD-10-CM

## 2020-01-31 MED ORDER — DANTROLENE SODIUM 50 MG PO CAPS: 100.0000 mg | ORAL_CAPSULE | Freq: Two times a day (BID) | ORAL | 5 refills | Status: DC

## 2020-01-31 NOTE — Progress Notes (Signed)
Patient is a 24 yr old R handed female with CP- spastic diplegia and has generalized anxiety disorder- On Sertraline for anxietyhere for f/u and trigger point injections.     Nothing major going on- overall doing well.   Wants to increase Dantrolene.  Takes a minute to do a forward transfer- if not careful, might tip, but no falls or near falls.  Taking Dantrolene 150 mg QHS.   Had an allergic rxn - throat and tongue itching- no swelling-  Didn't take meds that night.  Didn't sleep well that night- couldn't get comfortable- drawing up.  Definition of "tight"- pulling up.    Exam: Tight musculature/trigger points in upper traps and levators as well as R >L rhomboids.  MAS of LEs 2-3 B/L- esp hard to fully extend knees , and hips B/L- LLE harder to extend knee, but R side harder to extend hip.      Plan: 1. Increase Dantrolene- to-  100 mg BID Send in new Rx- with 5 refills.   2. Check CMP today since on Dantrolene- cannot go today, so ask her to do in the next 2 weeks or so. Will print out forms for it.   3. Needs w/c repair- piece over w/c- not wheel cover, but cover over wires of "up/down"- see if can get fixe-d being held on with gorilla tape.  5 years for W/C is July 2022.   w/c  5x in last 7 months- might need additional prescriptions? Vs emergency prescription for w/c???  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   4. 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 Will write this afterwards.    5. Patient here for trigger point injections for  Consent done and on chart.  Cleaned areas with alcohol and injected using a 27 gauge 1.5 inch needle  Injected  Using 1% Lidocaine with no EPI  Upper traps B/L Levators Posterior scalenes R only Middle scalenes Splenius Capitus Pectoralis Major Rhomboids 3 only on R Infraspinatus Teres Major/minor Thoracic paraspinals Lumbar paraspinals Other injections-  Still has a trigger  point right over R brachial plexus, so won't inject- suggest theracane  Patient's level of pain prior was 5-6/10 Current level of pain after injections is- about the same-  not popping now on L  There was no bleeding or complications.  Patient was advised to drink a lot of water on day after injections to flush system Will have increased soreness for 12-48 hours after injections.  Can use Lidocaine patches the day AFTER injections Can use theracane   Can use heating pad 4-6 hours AFTER injections  6. F/U in  4 weeks- trigger point injections   I spent a total of 35 minutes on visit- as detailed above.

## 2020-01-31 NOTE — Patient Instructions (Signed)
Plan: 1. Increase Dantrolene- to-  100 mg BID Send in new Rx- with 5 refills.   2. Check CMP today since on Dantrolene- cannot go today, so ask her to do in the next 2 weeks or so. Will print out forms for it.   3. Needs w/c repair- piece over w/c- not wheel cover, but cover over wires of "up/down"- see if can get fixe-d being held on with gorilla tape.  5 years for W/C is July 2022.   w/c  5x in last 7 months- might need additional prescriptions? Vs emergency prescription for w/c???  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   4. 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 Will write this afterwards.    5. Patient here for trigger point injections for  Consent done and on chart.  Cleaned areas with alcohol and injected using a 27 gauge 1.5 inch needle  Injected  Using 1% Lidocaine with no EPI  Upper traps B/L Levators Posterior scalenes R only Middle scalenes Splenius Capitus Pectoralis Major Rhomboids 3 only on R Infraspinatus Teres Major/minor Thoracic paraspinals Lumbar paraspinals Other injections-  Still has a trigger point right over R brachial plexus, so won't inject- suggest theracane  Patient's level of pain prior was 5-6/10 Current level of pain after injections is- about the same-  not popping now on L  There was no bleeding or complications.  Patient was advised to drink a lot of water on day after injections to flush system Will have increased soreness for 12-48 hours after injections.  Can use Lidocaine patches the day AFTER injections Can use theracane   Can use heating pad 4-6 hours AFTER injections  6. F/U in  4 weeks- trigger point injections

## 2020-03-01 ENCOUNTER — Other Ambulatory Visit: Payer: Self-pay

## 2020-03-01 ENCOUNTER — Encounter
Payer: Medicaid Other | Attending: Physical Medicine and Rehabilitation | Admitting: Physical Medicine and Rehabilitation

## 2020-03-01 ENCOUNTER — Encounter: Payer: Self-pay | Admitting: Physical Medicine and Rehabilitation

## 2020-03-01 VITALS — BP 131/83 | HR 89 | Temp 97.8°F | Ht <= 58 in

## 2020-03-01 DIAGNOSIS — M898X1 Other specified disorders of bone, shoulder: Secondary | ICD-10-CM

## 2020-03-01 DIAGNOSIS — R252 Cramp and spasm: Secondary | ICD-10-CM

## 2020-03-01 DIAGNOSIS — Z993 Dependence on wheelchair: Secondary | ICD-10-CM

## 2020-03-01 DIAGNOSIS — G808 Other cerebral palsy: Secondary | ICD-10-CM

## 2020-03-01 DIAGNOSIS — M7918 Myalgia, other site: Secondary | ICD-10-CM | POA: Diagnosis not present

## 2020-03-01 NOTE — Progress Notes (Signed)
Patient is a 24yr old R handed female with CP- spastic diplegia and has generalized anxiety disorder- On Sertraline for anxietyhere for f/u and trigger point injections.    Today is the last day of classes- finals start next week and move home on 12/10.   Pretty good on Dantrolene.  Sucks remembering 2nd dose-  Tries to do dinner and bedtime- 6 hrs apart. Spasms worse since woke up this AM  Exam: tight in trigger points- R>L rhomboids and upper traps/rhomboids and scalenes. Spasms worse and tighter in RLE- esp achilles tendon/tight.   Plan: 1. Use medication box to remember to take meds.   2. Doesn't think needs Rx for w/c repair- still being held together with gorilla tape- pt to check on it.   3. Patient here for trigger point injections for  Consent done and on chart.  Cleaned areas with alcohol and injected using a 27 gauge 1.5 inch needle  Injected 3cc Using 1% Lidocaine with no EPI  Upper traps R only Levators R nly Posterior scalenes R x2- dry needled lower aspect Middle scalenes Splenius Capitus B/L Pectoralis Major Rhomboids R x4 only- muscle now flat Infraspinatus Teres Major/minor Thoracic paraspinals Lumbar paraspinals Other injections-    Patient's level of pain prior was 5/10 Current level of pain after injections is 2/10  There was no bleeding or complications.  Patient was advised to drink a lot of water on day after injections to flush system Will have increased soreness for 12-48 hours after injections.  Can use Lidocaine patches the day AFTER injections Can use theracane on day of injections in places didn't inject Can use heating pad 4-6 hours AFTER injections  4. No refills needed right now.   5. F/U in 1 month  I spent a total of 25 minutes on visit- as detailed above.

## 2020-03-01 NOTE — Patient Instructions (Signed)
Plan: 1. Use medication box to remember to take meds.   2. Doesn't think needs Rx for w/c repair- still being held together with gorilla tape- pt to check on it.   3. Patient here for trigger point injections for  Consent done and on chart.  Cleaned areas with alcohol and injected using a 27 gauge 1.5 inch needle  Injected 3cc Using 1% Lidocaine with no EPI  Upper traps R only Levators R nly Posterior scalenes R x2- dry needled lower aspect Middle scalenes Splenius Capitus B/L Pectoralis Major Rhomboids R x4 only- muscle now flat Infraspinatus Teres Major/minor Thoracic paraspinals Lumbar paraspinals Other injections-    Patient's level of pain prior was 5/10 Current level of pain after injections is 2/10  There was no bleeding or complications.  Patient was advised to drink a lot of water on day after injections to flush system Will have increased soreness for 12-48 hours after injections.  Can use Lidocaine patches the day AFTER injections Can use theracane on day of injections in places didn't inject Can use heating pad 4-6 hours AFTER injections  4. No refills needed right now.   5. F/U in 1 month

## 2020-03-02 LAB — COMPREHENSIVE METABOLIC PANEL
ALT: 5 IU/L (ref 0–32)
AST: 15 IU/L (ref 0–40)
Albumin/Globulin Ratio: 1.9 (ref 1.2–2.2)
Albumin: 4.5 g/dL (ref 3.9–5.0)
Alkaline Phosphatase: 42 IU/L — ABNORMAL LOW (ref 44–121)
BUN/Creatinine Ratio: 17 (ref 9–23)
BUN: 10 mg/dL (ref 6–20)
Bilirubin Total: 0.2 mg/dL (ref 0.0–1.2)
CO2: 19 mmol/L — ABNORMAL LOW (ref 20–29)
Calcium: 9.1 mg/dL (ref 8.7–10.2)
Chloride: 105 mmol/L (ref 96–106)
Creatinine, Ser: 0.59 mg/dL (ref 0.57–1.00)
GFR calc Af Amer: 150 mL/min/{1.73_m2} (ref >59)
GFR calc non Af Amer: 131 mL/min/{1.73_m2} (ref >59)
Globulin, Total: 2.4 g/dL (ref 1.5–4.5)
Glucose: 79 mg/dL (ref 65–99)
Potassium: 4.5 mmol/L (ref 3.5–5.2)
Sodium: 139 mmol/L (ref 134–144)
Total Protein: 6.9 g/dL (ref 6.0–8.5)

## 2020-04-14 ENCOUNTER — Ambulatory Visit: Payer: Medicaid Other | Admitting: Physical Medicine and Rehabilitation

## 2020-04-24 ENCOUNTER — Telehealth: Payer: Self-pay

## 2020-04-24 NOTE — Telephone Encounter (Signed)
Patient called stating wheelchair stop working and wheelchair repair person said it is an Interior and spatial designer. She is requesting an order to get it repaired. She also states that Dantrolene directions was not correct for this month and did not get enough supply, only received #30 and should be #60, she is running out. She states she takes one(100 mg) twice daily.

## 2020-04-25 MED ORDER — DANTROLENE SODIUM 100 MG PO CAPS: 100.0000 mg | ORAL_CAPSULE | Freq: Two times a day (BID) | ORAL | 11 refills | Status: DC

## 2020-04-25 MED ORDER — DANTROLENE SODIUM 50 MG PO CAPS: 100.0000 mg | ORAL_CAPSULE | Freq: Two times a day (BID) | ORAL | 5 refills | Status: DC

## 2020-04-25 NOTE — Telephone Encounter (Signed)
I have attempted calling pt 2x AND her mother- Nella's voicemail is full and can't leave message.   Called mother- and left voicemail since no one would answer.   Also, Dantrolene order was placed CORRECTLY, however I ordered again and wrote in Rx to not short pt her meds.   ALSO, I don't know who gave her the power w/c- I cannot find a company to send the repair order to- she wasn't clear in her message- I spent 15 minutes calling around on that as well- I can write a prescription for "generic w/c repair" as of tomorrow when I'm in clinic, but not prior because not  in clinic and don't have Rx pad.   Basically,  Need to speak with pt to solve her issues- please make sure she answers phone vs has space on voicemail to leave message.

## 2020-04-27 NOTE — Telephone Encounter (Signed)
Spoke with patient states wheelchair said to hold on to script until next week sometime because he is not able to service her wheelchair at this time.

## 2020-04-28 ENCOUNTER — Other Ambulatory Visit: Payer: Self-pay

## 2020-04-28 ENCOUNTER — Encounter
Payer: Medicaid Other | Attending: Physical Medicine and Rehabilitation | Admitting: Physical Medicine and Rehabilitation

## 2020-04-28 ENCOUNTER — Encounter: Payer: Self-pay | Admitting: Physical Medicine and Rehabilitation

## 2020-04-28 VITALS — BP 139/85 | HR 104 | Temp 98.9°F

## 2020-04-28 DIAGNOSIS — G808 Other cerebral palsy: Secondary | ICD-10-CM | POA: Diagnosis not present

## 2020-04-28 DIAGNOSIS — Z993 Dependence on wheelchair: Secondary | ICD-10-CM | POA: Insufficient documentation

## 2020-04-28 DIAGNOSIS — M898X1 Other specified disorders of bone, shoulder: Secondary | ICD-10-CM | POA: Diagnosis present

## 2020-04-28 DIAGNOSIS — M7918 Myalgia, other site: Secondary | ICD-10-CM

## 2020-04-28 DIAGNOSIS — R252 Cramp and spasm: Secondary | ICD-10-CM | POA: Insufficient documentation

## 2020-04-28 NOTE — Progress Notes (Signed)
Patient is a 25yr old R handed female with CP- spastic diplegia and has generalized anxiety disorder- On Sertraline for anxietyhere for f/u and trigger point injections.    Her power w/c has stopped working-   Dantrolene- has been taking the 100 mg BID- 2pm and night time-   Some days was taking 1 pill/day because pharmacy only gave her 30 pills- not 60 pills, for some reason called in new Rx- even though it was correct.   A little tighter in L hand with decrease in Dantrolene due to pharmacy issues.   Spent a week in manual w/c because of problems with power w/c - having more issues with back muscles in manual w/c.   Having more pain in scalenes and down R shoulder/upper traps due to pushing manual w/c  Has no energy to do anything except school and push w/c.      Plan: 1. Wrote Rx for generic w/c repair- for power w/c- since power w/c has completely stopped working- will not move AT ALL- it's not battery or motor so far, nor the joystick- in process of elimination to try and figure out the issue.  Power w/c is 44.23 years old per pt- she thought was due to be replaced this July- but cannot tell if due to replacement until this summer or next power w/c. Absolutely needs w/c repair and if cannot be fixed,  Will need new power w/c- she's at [employer]  in graduate school- and HAS to have a w/c for her CP to be mobilie- and power w/c is the only way she CAN be mobile. Having to be pushed right now in manual w/c.    2. Give w/c company my number and we can send them copy of my note. Will also put in my plan and print out for pt.    3. Patient here for trigger point injections for  Consent done and on chart.  Cleaned areas with alcohol and injected using a 27 gauge 1.5 inch needle  Injected 5cc Using 1% Lidocaine with no EPI  Upper traps B/L Levators L only Posterior scalenes B/L Middle scalenes Splenius Capitus B/L Pectoralis Major Rhomboids R only  x4 Infraspinatus Teres Major/minor Thoracic paraspinals Lumbar paraspinals Other injections-    Patient's level of pain prior was 6/10 Current level of pain after injections is 3/10 now  There was no bleeding or complications.  Patient was advised to drink a lot of water on day after injections to flush system Will have increased soreness for 12-48 hours after injections.  Can use Lidocaine patches the day AFTER injections Can use theracane on day of injections in places didn't inject Can use heating pad 4-6 hours AFTER injections  4. F/U in 6 weeks.   I spent a total of 25 minutes on visit- as detailed above.

## 2020-04-28 NOTE — Patient Instructions (Signed)
Plan: 1. Wrote Rx for generic w/c repair- for power w/c- since power w/c has completely stopped working- will not move AT ALL- it's not battery or motor so far, nor the joystick- in process of elimination to try and figure out the issue.  Power w/c is 19.23 years old per pt- she thought was due to be replaced this July- but cannot tell if due to replacement until this summer or next power w/c. Absolutely needs w/c repair and if cannot be fixed,  Will need new power w/c- she's at [employer]  in graduate school- and HAS to have a w/c for her CP to be mobilie- and power w/c is the only way she CAN be mobile. Having to be pushed right now in manual w/c.    2. Give w/c company my number and we can send them copy of my note. Will also put in my plan and print out for pt.    3. Patient here for trigger point injections for  Consent done and on chart.  Cleaned areas with alcohol and injected using a 27 gauge 1.5 inch needle  Injected 5cc Using 1% Lidocaine with no EPI  Upper traps B/L Levators L only Posterior scalenes B/L Middle scalenes Splenius Capitus B/L Pectoralis Major Rhomboids R only x4 Infraspinatus Teres Major/minor Thoracic paraspinals Lumbar paraspinals Other injections-    Patient's level of pain prior was 6/10 Current level of pain after injections is 3/10 now  There was no bleeding or complications.  Patient was advised to drink a lot of water on day after injections to flush system Will have increased soreness for 12-48 hours after injections.  Can use Lidocaine patches the day AFTER injections Can use theracane on day of injections in places didn't inject Can use heating pad 4-6 hours AFTER injections  4. F/U in 6 weeks.

## 2020-06-07 ENCOUNTER — Encounter: Payer: Self-pay | Admitting: Physical Medicine and Rehabilitation

## 2020-06-07 ENCOUNTER — Encounter: Payer: Medicaid Other | Admitting: Physical Medicine and Rehabilitation

## 2020-06-07 ENCOUNTER — Other Ambulatory Visit: Payer: Self-pay

## 2020-06-07 ENCOUNTER — Encounter
Payer: Medicaid Other | Attending: Physical Medicine and Rehabilitation | Admitting: Physical Medicine and Rehabilitation

## 2020-06-07 VITALS — BP 125/83 | HR 81 | Temp 99.0°F

## 2020-06-07 DIAGNOSIS — Z993 Dependence on wheelchair: Secondary | ICD-10-CM | POA: Diagnosis not present

## 2020-06-07 DIAGNOSIS — M7918 Myalgia, other site: Secondary | ICD-10-CM | POA: Insufficient documentation

## 2020-06-07 DIAGNOSIS — G808 Other cerebral palsy: Secondary | ICD-10-CM | POA: Insufficient documentation

## 2020-06-07 DIAGNOSIS — R252 Cramp and spasm: Secondary | ICD-10-CM | POA: Insufficient documentation

## 2020-06-07 MED ORDER — METHOCARBAMOL 500 MG PO TABS: 500.0000 mg | ORAL_TABLET | Freq: Three times a day (TID) | ORAL | 3 refills | Status: DC | PRN

## 2020-06-07 NOTE — Progress Notes (Signed)
Patient is a 25yr old R handed female with CP- spastic diplegia and has generalized anxiety disorder- On Sertraline for anxietyhere for f/u and trigger point injections.   Still hasn't fixed power w/c. Has to do so many repairs in last few years.   Trying to replace w/c early.  On a wait list for eval.    Having various aches and pains.  Mainly R thumb- main of MCP.   Currently using OLD power w/c used as teenager- too small for her.  Think this could be cause/contributing to pains.  No elevating on this w/c; and grab bars on regular power w/c.   Has to transfer using thin front of arm rest (is round)- having for 1 month-ish. The time frame in the old power w/c.   Also having pain in L hip - wraps around from ASIA to PSIS. Everything does for stretching- nothing works.   Also having pain in R neck to R pec- as a band.   Is so tired all the time lately.    Out of manual w/c- can't get to class. Exam: Has trigger point in R 1st/2nd digit- in the webbing- very TTP Also R pec and R middle scalene has trigger point    Plan: Patient is a 25yr old R handed female with CP- spastic diplegia and has generalized anxiety disorder- On Sertraline for anxietyhere for f/u and trigger point injections.    1. Needs loaner for pt until power w/c comes in.  Health care equipment- used since was a child.  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  Needs for transfers, needs elevation specifically for transfers, can only transfer downwards.  Needs elevating leg rests due to spasticity control and fluctuating edema. As said above, current w/c is LEMON- has had >10 repairs in last 4 years and w/c company notes that cannot be fixed ANYMORE, or could be dangerous.  Literally will not move- motor is dead.  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 , so needs ASAP- of note, have ordered PT w/c eval and also needs pressure relieving cushion to prevent pressure ulcers for reasons stated above.   2. Pt's new pains are due to trigger points, which are most likely from poor seating.  Can treat today, but won't keep them relaxed.   3. We can try Robaxin as needed/Methocarbemol- is other name- 500 mg 3x/day as needed. Can be sedating- can also try to split dose and take during day if tolerated.   4. Don't change Dantrolene dose- con't current dosing. .Last check CMP- so not due until May 2022.   5. Patient here for trigger point injections for  Consent done and on chart.  Cleaned areas with alcohol and injected using a 27 gauge 1.5 inch needle  Injected 3cc Using 1% Lidocaine with no EPI  Upper traps B/L Levators  Posterior scalenes B/L Middle scalenes Splenius Capitus Pectoralis Major Rhomboids 3x on R Infraspinatus Teres Major/minor Thoracic paraspinals Lumbar paraspinals Other injections- R 1st interosseus- of hand   Patient's level of pain prior was 3/10 Current level of pain after injections is ROm is better- pain no better so  far.   There was no bleeding or complications.  Patient was advised to drink a lot of water on day after injections to flush system Will have increased soreness for 12-48 hours after injections.  Can use Lidocaine patches the day AFTER injections Can use theracane on day of injections in places didn't inject Can use heating pad 4-6 hours AFTER injections  6. F/U in 4 weeks. Trigger point injections

## 2020-06-07 NOTE — Patient Instructions (Signed)
Patient is a 25yr old R handed female with CP- spastic diplegia and has generalized anxiety disorder- On Sertraline for anxietyhere for f/u and trigger point injections.    1. Needs loaner for pt until power w/c comes in.  Health care equipment- used since was a child.  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  Needs for transfers, needs elevation specifically for transfers, can only transfer downwards.  Needs elevating leg rests due to spasticity control and fluctuating edema. As said above, current w/c is LEMON- has had >10 repairs in last 4 years and w/c company notes that cannot be fixed ANYMORE, or could be dangerous. Literally will not move- motor is dead.  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 , so needs ASAP- of note, have ordered PT w/c eval and also needs pressure relieving cushion to prevent pressure ulcers for reasons stated above.   2. Pt's new pains are due to trigger points, which are most likely from poor seating.  Can treat today, but won't keep them relaxed.   3. We can try Robaxin as needed/Methocarbemol- is other name- 500 mg 3x/day as needed. Can be sedating- can also try to split dose and take during day if tolerated.   4. Don't change Dantrolene dose- con't current dosing. .Last check CMP- so not due until May 2022.   5. Patient here for trigger point injections for  Consent done and on chart.  Cleaned areas with alcohol and injected using a 27 gauge 1.5 inch needle  Injected 3cc Using 1% Lidocaine with no EPI  Upper traps B/L Levators  Posterior scalenes B/L Middle scalenes Splenius Capitus Pectoralis Major Rhomboids 3x on R Infraspinatus Teres  Major/minor Thoracic paraspinals Lumbar paraspinals Other injections- R 1st interosseus- of hand   Patient's level of pain prior was 3/10 Current level of pain after injections is ROm is better- pain no better so far.   There was no bleeding or complications.  Patient was advised to drink a lot of water on day after injections to flush system Will have increased soreness for 12-48 hours after injections.  Can use Lidocaine patches the day AFTER injections Can use theracane on day of injections in places didn't inject Can use heating pad 4-6 hours AFTER injections  6. F/U in 4 weeks. Trigger point injections

## 2020-07-10 ENCOUNTER — Other Ambulatory Visit: Payer: Self-pay

## 2020-07-10 ENCOUNTER — Encounter: Payer: Self-pay | Admitting: Physical Medicine and Rehabilitation

## 2020-07-10 ENCOUNTER — Other Ambulatory Visit: Payer: Self-pay | Admitting: *Deleted

## 2020-07-10 ENCOUNTER — Encounter
Payer: Medicaid Other | Attending: Physical Medicine and Rehabilitation | Admitting: Physical Medicine and Rehabilitation

## 2020-07-10 VITALS — BP 141/88 | HR 87 | Temp 98.0°F

## 2020-07-10 DIAGNOSIS — R252 Cramp and spasm: Secondary | ICD-10-CM | POA: Insufficient documentation

## 2020-07-10 DIAGNOSIS — Z993 Dependence on wheelchair: Secondary | ICD-10-CM | POA: Insufficient documentation

## 2020-07-10 DIAGNOSIS — M7918 Myalgia, other site: Secondary | ICD-10-CM | POA: Insufficient documentation

## 2020-07-10 DIAGNOSIS — G808 Other cerebral palsy: Secondary | ICD-10-CM | POA: Insufficient documentation

## 2020-07-10 NOTE — Patient Instructions (Signed)
  Plan: 1. Pt has been evaluated for standing power w/c-  Justification- decreased pressure on spine and buttocks to help with pain management as well as reduced risk of Pressure ulcers. ; Will also help with ADLs- cooking, get into closet,  Help gravity with bowels; ease with ADLs- cooking, brushing teeth, as well as control spasticity and pain MUCH better and increase her ROM in hips/knees and allow her to maybe stand/get vertical- also would help positioning so would be taken more seriously in graduate school- where she's heading.  PT- [name] [employer] Care Equipment-    2. Got into graduate school in [place], [place].  Has to come back 1x/month to maintain Medicaid- so will be able to tell me what her schedule is.   Thinks will be remote Thursday/Friday.   3.  Spasticity worse, because taking less Dantrolene- Has to get better about taking night time dose of Dantrolene- suggest setting alarm on phone to  take it. Hasn't taken AM dose this AM- usually around 2pm.    4. Patient here for trigger point injections for  Consent done and on chart.  Cleaned areas with alcohol and injected using a 27 gauge 1.5 inch needle  Injected 3cc Using 1% Lidocaine with no EPI  Upper traps Levators R only Posterior scalenes R only Middle scalenes Splenius Capitus Pectoralis Major Rhomboids R x3 and 1 on L Infraspinatus Teres Major/minor Thoracic paraspinals B/L  Lumbar paraspinals Other injections-    Patient's level of pain prior was 5/10 Current level of pain after injections is 2/10  There was no bleeding or complications.  Patient was advised to drink a lot of water on day after injections to flush system Will have increased soreness for 12-48 hours after injections.  Can use Lidocaine patches the day AFTER injections Can use theracane on day of injections in places didn't inject Can use heating pad 4-6 hours AFTER injections    5. Sock aid- pediatric size and see  if can find one Needs to get her socks on.  6.  F/U in 6 weeks.

## 2020-07-10 NOTE — Progress Notes (Signed)
Patient is a 25yr old R handed female with CP- spastic diplegia and has generalized anxiety disorder- On Sertraline for anxiety.  Here for spasticity and myofascial pain f/u.    Still using power w/c from teenage years.   Got PT evaluation- for w/c.  Has recommended a standing power w/c.    Spasticity- about the same-  Again- has been taking the second dose of Dantrolene-1 100 mg/day-    Was off Anti-anxiety- kept denying her anxiety- Sertraline 125 mg-     Exam: Awake, alert, appropriate, in too small sized power w/c- (from teenage years)- NAD Keeping shoulders back a little more than when first seen MAS of 3 in knees B/L 2-3 in Hips Cannot extend knees completely- is lacking at least 30 degrees of knees right this moment (usually is better than this) Trigger points mainly R rhomboid- a little bit on L and tight scalenes B/L  Plan: 1. Pt has been evaluated for standing power w/c-  Justification- decreased pressure on spine and buttocks to help with pain management as well as reduced risk of Pressure ulcers. ; Will also help with ADLs- cooking, get into closet,  Help gravity with bowels; ease with ADLs- cooking, brushing teeth, as well as control spasticity and pain MUCH better and increase her ROM in hips/knees and allow her to maybe stand/get vertical- also would help positioning so would be taken more seriously in graduate school- where she's heading.  PT- [name] [employer] Care [employer]-    2. Got into graduate school in [place], [place].  Has to come back 1x/month to maintain Medicaid- so will be able to tell me what her schedule is.   Thinks will be remote Thursday/Friday.   3.  Spasticity worse, because taking less Dantrolene- Has to get better about taking night time dose of Dantrolene- suggest setting alarm on phone to  take it. Hasn't taken AM dose this AM- usually around 2pm.    4. Patient here for trigger point injections for  Consent done and on  chart.  Cleaned areas with alcohol and injected using a 27 gauge 1.5 inch needle  Injected 3cc Using 1% Lidocaine with no EPI  Upper traps Levators R only Posterior scalenes R only Middle scalenes Splenius Capitus Pectoralis Major Rhomboids R x3 and 1 on L Infraspinatus Teres Major/minor Thoracic paraspinals B/L  Lumbar paraspinals Other injections-    Patient's level of pain prior was 5/10 Current level of pain after injections is 2/10  There was no bleeding or complications.  Patient was advised to drink a lot of water on day after injections to flush system Will have increased soreness for 12-48 hours after injections.  Can use Lidocaine patches the day AFTER injections Can use theracane on day of injections in places didn't inject Can use heating pad 4-6 hours AFTER injections    5. Sock aid- pediatric size and see if can find one Needs to get her socks on.  6.  F/U in 6 weeks.

## 2020-09-01 ENCOUNTER — Other Ambulatory Visit: Payer: Self-pay

## 2020-09-01 ENCOUNTER — Encounter: Payer: Self-pay | Admitting: Physical Medicine and Rehabilitation

## 2020-09-01 ENCOUNTER — Encounter
Payer: Medicaid Other | Attending: Physical Medicine and Rehabilitation | Admitting: Physical Medicine and Rehabilitation

## 2020-09-01 VITALS — BP 116/70 | HR 84 | Temp 99.3°F

## 2020-09-01 DIAGNOSIS — Z993 Dependence on wheelchair: Secondary | ICD-10-CM | POA: Insufficient documentation

## 2020-09-01 DIAGNOSIS — R252 Cramp and spasm: Secondary | ICD-10-CM | POA: Diagnosis not present

## 2020-09-01 DIAGNOSIS — M7918 Myalgia, other site: Secondary | ICD-10-CM | POA: Diagnosis not present

## 2020-09-01 DIAGNOSIS — M898X1 Other specified disorders of bone, shoulder: Secondary | ICD-10-CM | POA: Insufficient documentation

## 2020-09-01 DIAGNOSIS — G808 Other cerebral palsy: Secondary | ICD-10-CM | POA: Insufficient documentation

## 2020-09-01 NOTE — Progress Notes (Signed)
Patient is a 25yr old R handed female with CP- spastic diplegia and has generalized anxiety disorder- On Sertraline for anxiety.  Here for spasticity f/u and trigger point injections for pain control.   School out for the summer- graduated in May, 2022- and starting grad school in Fall.  Not taking any summer classes.  First day of classes 8/22.   Neck and shoulders still real tight and painful even with loaner power w/c.    PRN muscle relaxer- has been taking consistently at night- helped a lot with sleep- and muscle tightness overnight.  Pain not waking up at much.   Doing better taking Dantrolene 2x/day.   Mother concerned about urinary incontinence- Was referred by PCP to Urologist here in [place], [place]. Has appointment on 6/22 but doesn't know the name of doctor.   Mother also concerned constipation-  Having BMs q3-4 days-  Doesn't empty when goes-  Tried miralax- taken 1x and dulcolax prn- that does work.   Getting "funky" toe looked at on B/L feet- by podiatrist- stands on it when standing. 2nd toe actually goes under great toe   Has med system that works for her now- got at [employer].     Exam: Awake, alert, appropriate, in power w/c with joystick on R; NAD Great toe actually almost covering 2nd toe on both feet.  Actively having sever spasm of L calf/gastroc/soleus- can palpate it! Active trigger points in R rhomboid and L rhomboid, R upper trap and scalene and splenius capitus  Plan: 1. Make sure see pt before she goes off to grad school.   2. Is safe to take a Dulcolax daily- to maintain normal bowel function- ideally want all my patients to go 2-3x/week minimum- not everyone goes daily!- Esp if has CP or SCI or MS- neuro disorders.   3. Can get acclimated to bowel meds ~ 7-10 years and might need to increase bowel meds at that time, but takes that long to get acclimated.   4. Smooth muscle is bladder and bowel except end of rectum- and muscles on body are  skeletal muscles- Dantrolene doesn't affect Smooth or cardiac muscle; only skeletal! Just FYI.   5. Patient here for trigger point injections for R scapular pain and myofascial pain.  Consent done and on chart.  Cleaned areas with alcohol and injected using a 27 gauge 1.5 inch needle  Injected 4cc Using 1% Lidocaine with no EPI  Upper traps R only Levators Posterior scalenes R only Middle scalenes Splenius Capitus R only Pectoralis Major Rhomboids B/L Infraspinatus Teres Major/minor Thoracic paraspinals Lumbar paraspinals Other injections-    Patient's level of pain prior was 7/10 Current level of pain after injections is 4/10- injeciton site in neck main issue  There was no bleeding or complications.  Patient was advised to drink a lot of water on day after injections to flush system Will have increased soreness for 12-48 hours after injections.  Can use Lidocaine patches the day AFTER injections Can use theracane on day of injections in places didn't inject Can use heating pad 4-6 hours AFTER injections  6. Needs CMP- due to being on Dantrolene needs LFTs checked q6 months- last check was 12/21. To make sure no elevated LFTs while on Dantrolene.   7.  F/U in 6 weeks- for f/u and TrP injections- before 7/31  8. Doesn't need refills today.  Con't Dantrolene and Robaxin prn.    I spent a total of 30 minutes on visit- 20 minutes on f/u  and 10 minutes on trigger point injections.

## 2020-09-01 NOTE — Patient Instructions (Signed)
Plan: 1. Make sure see pt before she goes off to grad school.   2. Is safe to take a Dulcolax daily- to maintain normal bowel function- ideally want all my patients to go 2-3x/week minimum- not everyone goes daily!- Esp if has CP or SCI or MS- neuro disorders.   3. Can get acclimated to bowel meds ~ 7-10 years and might need to increase bowel meds at that time, but takes that long to get acclimated.   4. Smooth muscle is bladder and bowel except end of rectum- and muscles on body are skeletal muscles- Dantrolene doesn't affect Smooth or cardiac muscle; only skeletal! Just FYI.   5. Patient here for trigger point injections for R scapular pain and myofascial pain.  Consent done and on chart.  Cleaned areas with alcohol and injected using a 27 gauge 1.5 inch needle  Injected 4cc Using 1% Lidocaine with no EPI  Upper traps R only Levators Posterior scalenes R only Middle scalenes Splenius Capitus R only Pectoralis Major Rhomboids B/L Infraspinatus Teres Major/minor Thoracic paraspinals Lumbar paraspinals Other injections-    Patient's level of pain prior was 7/10 Current level of pain after injections is 4/10- injeciton site in neck main issue  There was no bleeding or complications.  Patient was advised to drink a lot of water on day after injections to flush system Will have increased soreness for 12-48 hours after injections.  Can use Lidocaine patches the day AFTER injections Can use theracane on day of injections in places didn't inject Can use heating pad 4-6 hours AFTER injections  6. Needs CMP- due to being on Dantrolene needs LFTs checked q6 months- last check was 12/21. To make sure no elevated LFTs while on Dantrolene.   7.  F/U in 6 weeks- for f/u and TrP injections- before 7/31

## 2020-09-02 LAB — COMPREHENSIVE METABOLIC PANEL
ALT: 5 IU/L (ref 0–32)
AST: 11 IU/L (ref 0–40)
Albumin/Globulin Ratio: 1.8 (ref 1.2–2.2)
Albumin: 4.4 g/dL (ref 3.9–5.0)
Alkaline Phosphatase: 38 IU/L — ABNORMAL LOW (ref 44–121)
BUN/Creatinine Ratio: 16 (ref 9–23)
BUN: 8 mg/dL (ref 6–20)
Bilirubin Total: <0.2 mg/dL (ref 0.0–1.2)
CO2: 20 mmol/L (ref 20–29)
Calcium: 9 mg/dL (ref 8.7–10.2)
Chloride: 105 mmol/L (ref 96–106)
Creatinine, Ser: 0.5 mg/dL — ABNORMAL LOW (ref 0.57–1.00)
Globulin, Total: 2.5 g/dL (ref 1.5–4.5)
Glucose: 70 mg/dL (ref 65–99)
Potassium: 4 mmol/L (ref 3.5–5.2)
Sodium: 139 mmol/L (ref 134–144)
Total Protein: 6.9 g/dL (ref 6.0–8.5)
eGFR: 136 mL/min/{1.73_m2} (ref >59)

## 2020-09-06 ENCOUNTER — Other Ambulatory Visit: Payer: Self-pay

## 2020-09-06 ENCOUNTER — Ambulatory Visit (INDEPENDENT_AMBULATORY_CARE_PROVIDER_SITE_OTHER): Payer: Medicaid Other | Admitting: Podiatry

## 2020-09-06 DIAGNOSIS — M205X2 Other deformities of toe(s) (acquired), left foot: Secondary | ICD-10-CM

## 2020-09-06 DIAGNOSIS — M205X1 Other deformities of toe(s) (acquired), right foot: Secondary | ICD-10-CM | POA: Diagnosis not present

## 2020-09-06 DIAGNOSIS — G809 Cerebral palsy, unspecified: Secondary | ICD-10-CM

## 2020-09-06 DIAGNOSIS — L600 Ingrowing nail: Secondary | ICD-10-CM | POA: Diagnosis not present

## 2020-09-06 NOTE — Progress Notes (Signed)
   HPI: 25 y.o. female presenting today as a new patient PMHx cerebral palsy for multiple issues regarding the patient's bilateral feet.  First and foremost the patient states that ever since she was about 25 years old she noticed that her second toe was flexed down compared to the adjacent digits bilateral.  Currently her great toe sits on top of her second toes when nonweightbearing.    Also the patient is concerned for possible ingrown toenails to the bilateral nail plates.  She states that they are occasionally symptomatic and painful.  She is also concerned for the discoloration and thickening of the nails.  Patient is mostly wheelchair-bound and stands for short distances with a walker or for transitional purposes.  She presents today in her electric wheelchair  Past Medical History:  Diagnosis Date  . Anxiety   . Cerebral palsy (HCC)   . Dysmenorrhea      Physical Exam: General: The patient is alert and oriented x3 in no acute distress.  Dermatology: Skin is warm, dry and supple bilateral lower extremities. Negative for open lesions or macerations.  Mild hyperkeratosis of the nails with slight discoloration consistent with possible mild onychomycosis of the toenails.  The corners of the nails do appear to be digging into the distal portion of the nail folds bilateral.  Vascular: Palpable pedal pulses bilaterally. No edema or erythema noted. Capillary refill within normal limits.  Neurological: Epicritic and protective threshold grossly intact bilaterally.   Musculoskeletal Exam: Cerebral palsy mostly wheelchair-bound.  Spastic reducible plantarflexed second digits bilateral  Assessment: 1.  Onychomycosis of toenails bilateral with mild ingrowing nail 2.  Spastic reducible plantarflex second digits bilateral   Plan of Care:  1. Patient evaluated.  2.  Mechanical debridement of the great toenails was performed using a nail nipper without incident or bleeding.  The patient did  notice some relief with debridement 3.  Topical antifungal Tolcylen dispensed here in the office to begin applying daily to the toenails 4.  Regarding the plantar flexed toes, I do feel that there is nothing conservatively to elevate the second digits.  The patient is concerned that the toes are plantarflexed and will cause problems down the road.  She also states that when she applies pressure she feels as if the toes are breaking.  I do believe in order to best address the spastic plantar flexion of the toes simple percutaneous flexor tenotomy of the 2 digits may provide significant relief.  Minimal complications or continence associated to the flexor tenotomies.  She would like to go home and discuss this with her mother 5.  Return to clinic in 2 weeks with her mother to discuss the flexor tenotomy procedures and possibly proceed with flexor tenotomy's  *Just graduated in Social Work.  Moving this summer for graduate school      Felecia Shelling, DPM [facility]  Dr. Felecia Shelling, DPM    [address] [place], [place] [address]                Office [phone]  Fax [phone]

## 2020-09-20 ENCOUNTER — Telehealth: Payer: Self-pay | Admitting: *Deleted

## 2020-09-20 NOTE — Telephone Encounter (Signed)
Prior auth for Dantrolene 100 mg bid #[id number] submitted on [email or web address].

## 2020-09-21 NOTE — Telephone Encounter (Signed)
PA approved Confirmation #:[id number]   Effective Begin Date:09/20/2020   Effective End Date:09/15/2021.  Loriann notified.

## 2020-09-27 ENCOUNTER — Ambulatory Visit: Payer: Medicaid Other | Admitting: Podiatry

## 2020-09-27 ENCOUNTER — Other Ambulatory Visit: Payer: Self-pay

## 2020-09-27 DIAGNOSIS — M2042 Other hammer toe(s) (acquired), left foot: Secondary | ICD-10-CM | POA: Diagnosis not present

## 2020-09-27 DIAGNOSIS — M2041 Other hammer toe(s) (acquired), right foot: Secondary | ICD-10-CM

## 2020-09-27 DIAGNOSIS — M205X2 Other deformities of toe(s) (acquired), left foot: Secondary | ICD-10-CM | POA: Diagnosis not present

## 2020-09-27 DIAGNOSIS — G809 Cerebral palsy, unspecified: Secondary | ICD-10-CM

## 2020-09-27 DIAGNOSIS — M205X1 Other deformities of toe(s) (acquired), right foot: Secondary | ICD-10-CM

## 2020-09-27 MED ORDER — GENTAMICIN SULFATE 0.1 % EX CREA: 1.0000 "application " | TOPICAL_CREAM | Freq: Three times a day (TID) | CUTANEOUS | 0 refills | Status: DC

## 2020-09-27 NOTE — Progress Notes (Signed)
   HPI: 25 y.o. female presenting today with her mother to proceed forward with flexor tenotomy's to the bilateral second toes.  Patient was here in the office 2 weeks ago, 09/06/2020 when we discussed possible flexor tenotomy's to the spastic plantarflexed second digits bilateral.  She states that she has been discussing it with her mother and they would like to proceed with the procedure.  Patient is mostly wheelchair-bound and stands for short distances with a walker for transitional purposes.  She is concerned because when her second toe is plantar flexed and has tendency to get caught on the ground.  She presents for further treatment and evaluation  Past Medical History:  Diagnosis Date   Anxiety    Cerebral palsy (HCC)    Dysmenorrhea      Objective: Physical Exam General: The patient is alert and oriented x3 in no acute distress.  Dermatology: Skin is cool, dry and supple bilateral lower extremities. Negative for open lesions or macerations.  Vascular: Palpable pedal pulses bilaterally. No edema or erythema noted. Capillary refill within normal limits.  Neurological: Epicritic and protective threshold grossly intact bilaterally.   Musculoskeletal Exam: Cerebral palsy mostly wheelchair-bound.  Spastic reducible plantarflexed second digits bilateral.  Reducible hammertoe contracture deformity noted to the symptomatic toe, second digit bilateral  Assessment: 1.  Reducible hammertoe second digit bilateral 2.  Spastic reducible plantarflexed second digits bilateral   Plan of Care:  1. Patient evaluated.  2.  Different treatment options were discussed with the patient. 3.  After evaluating the patient I do believe that a flexor tenotomy to the respective digit would help alleviate the patient's symptoms.  This would lift the toe to alleviate pressure from the digit.  The procedure was explained in detail and all patient questions were answered.  No guarantees were expressed or implied.   The patient consented for correction here in the office 4.  Prior to procedure the toe was blocked in a digital block fashion using 3 mL of lidocaine 2%. 5.  Flexor tenotomy was performed of the respective digit using a surgical #11 scalpel and a small percutaneous stab incision on the plantar sulcus of the toe.  The toe was immediately in a more rectus position.  Betadine soaked dry sterile dressing was applied. 6.  Post care instructions were provided 7.  Surgical shoe dispensed 8.  Return to clinic in 1 week  *Just graduated in social work.  Moving this summer for graduate school    Felecia Shelling, DPM [facility]  Dr. Felecia Shelling, DPM    [address] [place], [place] [address]                Office [phone]  Fax [phone]

## 2020-10-04 ENCOUNTER — Ambulatory Visit (INDEPENDENT_AMBULATORY_CARE_PROVIDER_SITE_OTHER): Payer: Medicaid Other | Admitting: Podiatry

## 2020-10-04 ENCOUNTER — Other Ambulatory Visit: Payer: Self-pay

## 2020-10-04 DIAGNOSIS — M2042 Other hammer toe(s) (acquired), left foot: Secondary | ICD-10-CM

## 2020-10-04 DIAGNOSIS — G809 Cerebral palsy, unspecified: Secondary | ICD-10-CM

## 2020-10-04 DIAGNOSIS — M2041 Other hammer toe(s) (acquired), right foot: Secondary | ICD-10-CM

## 2020-10-04 NOTE — Progress Notes (Signed)
   Subjective:  Patient presents today status post flexor tenotomy's of the second digits bilateral. DOS: 09/27/2020.  Patient states that the toes are in a more rectus position.  The left second toe is still slightly plantarflex compared to the remaining adjacent digits.  Overall there is improvement and there is no pain associated to the areas.  Past Medical History:  Diagnosis Date   Anxiety    Cerebral palsy (HCC)    Dysmenorrhea       Objective/Physical Exam Neurovascular status intact.  The small plantar percutaneous skin incisions at the sulcus of the second toes bilateral appear to be well coapted and healed. No sign of infectious process noted. No dehiscence. No active bleeding noted. Moderate edema noted to the surgical extremity. The second digits are in a more rectus position and it appears that the the plantar pull of the flexor tendon has been alleviated significantly  Assessment: 1. s/p flexor tenotomy second digits bilateral. DOS: 09/27/2020   Plan of Care:  1. Patient was evaluated. 2.  Patient may discontinue postoperative shoes 3.  Patient may resume full activity no restrictions 4.  Return to clinic as needed  *Just graduated in social work.  Moving this summer for graduate school   Felecia Shelling, DPM [facility]  Dr. Felecia Shelling, DPM    [address] [place], [place] [address]                Office [phone]  Fax [phone]

## 2020-10-27 ENCOUNTER — Other Ambulatory Visit: Payer: Self-pay

## 2020-10-27 ENCOUNTER — Encounter: Payer: Self-pay | Admitting: Physical Medicine and Rehabilitation

## 2020-10-27 ENCOUNTER — Encounter
Payer: Medicaid Other | Attending: Physical Medicine and Rehabilitation | Admitting: Physical Medicine and Rehabilitation

## 2020-10-27 VITALS — BP 120/82 | HR 97 | Temp 98.8°F | Wt 90.8 lb

## 2020-10-27 DIAGNOSIS — R252 Cramp and spasm: Secondary | ICD-10-CM | POA: Diagnosis present

## 2020-10-27 DIAGNOSIS — M41113 Juvenile idiopathic scoliosis, cervicothoracic region: Secondary | ICD-10-CM

## 2020-10-27 DIAGNOSIS — G808 Other cerebral palsy: Secondary | ICD-10-CM

## 2020-10-27 DIAGNOSIS — Z993 Dependence on wheelchair: Secondary | ICD-10-CM | POA: Diagnosis present

## 2020-10-27 DIAGNOSIS — M7918 Myalgia, other site: Secondary | ICD-10-CM | POA: Diagnosis present

## 2020-10-27 MED ORDER — DANTROLENE SODIUM 100 MG PO CAPS: 100.0000 mg | ORAL_CAPSULE | Freq: Two times a day (BID) | ORAL | 3 refills | Status: DC

## 2020-10-27 NOTE — Progress Notes (Signed)
Patient is a 25 yr old R handed female with CP- spastic diplegia and has generalized anxiety disorder- On Sertraline for anxiety. Also has myofascial pain Here for f/u on CP and TrP injections.    Got new w/c 1 month ago- is a standing w/c [employer] F5 [employer] VF- power w/c- front wheel drive, so working on learning to drive again.   Not great with it yet.  Also has thigh adductor/hip guide pads pads and core/waist pads and seatbelt and chest strap and knee block for standing.  Hates the knee block when not standing.  Ophtho retired- and was wondering who to see.   Leaves 8/12 for school that starts 8/22.   Saw Urology- to get Urodynamics in September- was told leakage could be from low amount of bacteria in urine- given ABX. Had no Sx's of UTI otherwise.    Using Dulcolax pill- sometimes- hasn't really needed it since off most recent PO ABX for UTI?   Exam: L foot- great toe slightly overlapping 2nd toe still, but R foot great toe and 2nd toe side by side (s/p tenontotomy in office by podiatry).  Also has more of a ridge ? Scoliosis of R paraspinal/scapula?  Plan:  Referral for Ophthalmology per pt request.   2. Continue Dantrolene. But thinking about increasing dose- hard to assess when has knee block for standing w/c- so she wants to wait-  Dantrolene- 100 mg 2x/day- 3 months supply- 3 refills.  Wait to increase for now.   3. Patient here for trigger point injections for  Consent done and on chart.  Cleaned areas with alcohol and injected using a 27 gauge 1.5 inch needle  Injected 4cc Using 1% Lidocaine with no EPI  Upper traps B/L  Levators L only Posterior scalenes  only Middle scalenes Splenius Capitus Pectoralis Major B/L  Rhomboids on R x6 Infraspinatus Teres Major/minor Thoracic paraspinals Lumbar paraspinals Other injections- wait on R hand   Patient's level of pain prior was 5-7/10 very stiff right now Current level of pain after injections is  2/10  There was no bleeding or complications.  Patient was advised to drink a lot of water on day after injections to flush system Will have increased soreness for 12-48 hours after injections.  Can use Lidocaine patches the day AFTER injections Can use theracane on day of injections in places didn't inject Can use heating pad 4-6 hours AFTER injections  4. Xray of cervical/thoracic xrays to f/u on scoliosis.    5/ F/U in September 9/23 and will schedule in 6 weeks from that appointment. For trP injections.   I spent a total of 39 minutes on visit- 10 minutes on injections. To d/w spasticity and scoliosis and concern for more tight. Ordered xrays as a result.

## 2020-10-27 NOTE — Patient Instructions (Signed)
Plan:  Referral for Ophthalmology per pt request.   2. Continue Dantrolene. But thinking about increasing dose- hard to assess when has knee block for standing w/c- so she wants to wait- Dantrolene- 100 mg 2x/day- 3 months supply- 3 refills. Wait to increase for now.   3. Patient here for trigger point injections for  Consent done and on chart.   Cleaned areas with alcohol and injected using a 27 gauge 1.5 inch needle   Injected 4cc Using 1% Lidocaine with no EPI   Upper traps B/L  Levators L only Posterior scalenes  only Middle scalenes Splenius Capitus Pectoralis Major B/L  Rhomboids on R x6 Infraspinatus Teres Major/minor Thoracic paraspinals Lumbar paraspinals Other injections- wait on R hand     Patient's level of pain prior was 5-7/10 very stiff right now Current level of pain after injections is 2/10   There was no bleeding or complications.   Patient was advised to drink a lot of water on day after injections to flush system Will have increased soreness for 12-48 hours after injections. Can use Lidocaine patches the day AFTER injections Can use theracane on day of injections in places didn't inject Can use heating pad 4-6 hours AFTER injections   4. Xray of cervical/thoracic xrays to f/u on scoliosis.     5/ F/U in September 9/23 and will schedule in 6 weeks from that appointment. For trP injections.

## 2020-12-22 ENCOUNTER — Encounter: Payer: Medicaid Other | Admitting: Physical Medicine and Rehabilitation

## 2021-01-29 ENCOUNTER — Telehealth: Payer: Self-pay | Admitting: *Deleted

## 2021-01-29 NOTE — Telephone Encounter (Signed)
Melissa Wagner called about her appt with Dr Berline Chough on 11/4 @ 1:00.  She was asking if it was necessary for her to have her xrays done before this appt.  I see that they were placed back in July but her problem is she is not in the state right now and will be coming in on Thursday in preparation for the appt with Dr Berline Chough.  She also has another appt at 10:30 Friday morning so with transportation she is not sure how do-able the xray will be. She also was asking if blood work is going to be necessary.

## 2021-02-02 ENCOUNTER — Encounter
Payer: Medicaid Other | Attending: Physical Medicine and Rehabilitation | Admitting: Physical Medicine and Rehabilitation

## 2021-02-02 ENCOUNTER — Other Ambulatory Visit: Payer: Self-pay

## 2021-02-02 ENCOUNTER — Encounter: Payer: Self-pay | Admitting: Physical Medicine and Rehabilitation

## 2021-02-02 ENCOUNTER — Ambulatory Visit
Admission: RE | Admit: 2021-02-02 | Discharge: 2021-02-02 | Disposition: A | Discharge status: Home / Self Care | Payer: Medicaid Other | Source: Ambulatory Visit | Attending: Physical Medicine and Rehabilitation | Admitting: Physical Medicine and Rehabilitation

## 2021-02-02 VITALS — BP 126/75 | HR 90 | Temp 98.2°F | Ht <= 58 in

## 2021-02-02 DIAGNOSIS — Z993 Dependence on wheelchair: Secondary | ICD-10-CM | POA: Diagnosis not present

## 2021-02-02 DIAGNOSIS — R252 Cramp and spasm: Secondary | ICD-10-CM | POA: Diagnosis not present

## 2021-02-02 DIAGNOSIS — G808 Other cerebral palsy: Secondary | ICD-10-CM

## 2021-02-02 DIAGNOSIS — M7918 Myalgia, other site: Secondary | ICD-10-CM | POA: Diagnosis not present

## 2021-02-02 DIAGNOSIS — M41113 Juvenile idiopathic scoliosis, cervicothoracic region: Secondary | ICD-10-CM

## 2021-02-02 NOTE — Patient Instructions (Signed)
Plan: Need to call Urologist and- and ask/get an agreement to use lidocaine jelly, lubrication and pediatric catheter to cath her, if possible- and then try Urodynamics.    2. Patient here for trigger point injections for  Consent done and on chart.  Cleaned areas with alcohol and injected using a 27 gauge 1.5 inch needle  Injected  Using 1% Lidocaine with no EPI  Upper traps B/L  Levators- R only Posterior scalenes- B/L Middle scalenes Splenius Capitus- R only Pectoralis Major B/L  Rhomboids- R only Infraspinatus Teres Major/minor Thoracic paraspinals- R x5 and L x1 Lumbar paraspinals Other injections-    Patient's level of pain prior was 9-10/10 Current level of pain after injections is  There was no bleeding or complications.  Patient was advised to drink a lot of water on day after injections to flush system Will have increased soreness for 12-48 hours after injections.  Can use Lidocaine patches the day AFTER injections Can use theracane on day of injections in places didn't inject Can use heating pad 4-6 hours AFTER injections   3. Did myofascial release of L middle scalenes- got neck to relax.    4. Con't Dantrolene at current dose- 100 mg BID for spasticity.   5. Can get CMP at next appointment-    6.  Tell them there's an oral drug test- since very difficult to do [facility] test for drug screen. If needed in future.    7. F/U 6-8 weeks for trigger point injections. Will not do double.

## 2021-02-02 NOTE — Progress Notes (Signed)
Patient is a 25 yr old R handed female with CP- spastic diplegia and has generalized anxiety disorder- On Sertraline for anxiety. Also has myofascial pain Here for f/u on diplegic CP and also here for TrP injections for pain control. .    Started grad school.  Haven't seen her  since late July.  Got xrays we ordered in July this AM.  Had to get flu shot and Hep B vaccine- arms hurt.    Very tight/pain hasn't been so bad today since took a muscle relaxant this AM- Robaxin- took at 8am- took so could lay still for xrays.   Most days in last 1 month-  Feels like constantly burned with hot poker in neck and shoulders- 9/10.   Pain at 7/10- down to baseline right now after Robaxin. Also having more headaches thinks due to needing trigger point injections  When takes Robaxin, cannot wake up the next AM- doesn't make her too sleepy but is scared it might, so doesn't take during the day.   We missed 9/23 appt since I had COVID.   Attempted to have Urodynamics test done- didn't go well- didn't do lidocaine/lubrication and tried to cath her with adult sized catheter.   Was supposed to have urodynamics again this AM- and cancelled.       Plan: Need to call Urologist and- and ask/get an agreement to use lidocaine jelly, lubrication and pediatric catheter to cath her, if possible- and then try Urodynamics.    2. Patient here for trigger point injections for  Consent done and on chart.  Cleaned areas with alcohol and injected using a 27 gauge 1.5 inch needle  Injected  Using 1% Lidocaine with no EPI  Upper traps B/L  Levators- R only Posterior scalenes- B/L Middle scalenes Splenius Capitus- R only Pectoralis Major B/L  Rhomboids- R only Infraspinatus Teres Major/minor Thoracic paraspinals- R x5 and L x1 Lumbar paraspinals Other injections-    Patient's level of pain prior was 9-10/10 Current level of pain after injections is  There was no bleeding or  complications.  Patient was advised to drink a lot of water on day after injections to flush system Will have increased soreness for 12-48 hours after injections.  Can use Lidocaine patches the day AFTER injections Can use theracane on day of injections in places didn't inject Can use heating pad 4-6 hours AFTER injections   3. Did myofascial release of L middle scalenes- got neck to relax.    4. Con't Dantrolene at current dose- 100 mg BID for spasticity.   5. Can get CMP at next appointment-    6.  Tell them there's an oral drug test- since very difficult to do [facility] test for drug screen. If needed in future.    7. F/U 6-8 weeks for trigger point injections. Will not do double.

## 2021-04-04 ENCOUNTER — Encounter
Payer: PRIVATE HEALTH INSURANCE | Attending: Physical Medicine and Rehabilitation | Admitting: Physical Medicine and Rehabilitation

## 2021-04-04 ENCOUNTER — Encounter: Payer: Self-pay | Admitting: Physical Medicine and Rehabilitation

## 2021-04-04 ENCOUNTER — Other Ambulatory Visit: Payer: Self-pay

## 2021-04-04 VITALS — BP 127/83 | HR 86

## 2021-04-04 DIAGNOSIS — M898X1 Other specified disorders of bone, shoulder: Secondary | ICD-10-CM

## 2021-04-04 DIAGNOSIS — Z993 Dependence on wheelchair: Secondary | ICD-10-CM

## 2021-04-04 DIAGNOSIS — R252 Cramp and spasm: Secondary | ICD-10-CM | POA: Diagnosis present

## 2021-04-04 DIAGNOSIS — G808 Other cerebral palsy: Secondary | ICD-10-CM | POA: Diagnosis present

## 2021-04-04 DIAGNOSIS — M7918 Myalgia, other site: Secondary | ICD-10-CM

## 2021-04-04 NOTE — Patient Instructions (Signed)
Plan: See if possible to move joystick to a more proximal position so doesn't have to reach as far for joystick. Might reduce R rhomboids trigger points.   2.  Due to CMP today- since on Dantrolene- LFTs  3. Can try Dantrolene 200 mg qam- and make sure no side effects since don't normally give 200 mg at once- but will try since she cannot take the 2nd dose pretty much all the time.   4.. Thinks has undiagnosed ADHD. Hasn't been tested prior.    5. Patient here for trigger point injections for  Consent done and on chart.  Cleaned areas with alcohol and injected using a 27 gauge 1.5 inch needle  Injected 4.5cc Using 1% Lidocaine with no EPI  Upper traps B/L  Levators Posterior scalenes Middle scalenes- B/L  Splenius Capitus- R only Pectoralis Major Rhomboids- 4x on R- and 2x on L Infraspinatus Teres Major/minor Thoracic paraspinals Lumbar paraspinals Other injections-    Patient's level of pain prior was 4/10 Current level of pain after injections is- ~ 2/10  There was no bleeding or complications.  Patient was advised to drink a lot of water on day after injections to flush system Will have increased soreness for 12-48 hours after injections.  Can use Lidocaine patches the day AFTER injections Can use theracane on day of injections in places didn't inject Can use heating pad 4-6 hours AFTER injections  5. Went over scoliosis and curve sin cervical and lower thoracic spine-   6. F/U q2 months. For Trigger point injections.

## 2021-04-04 NOTE — Progress Notes (Signed)
Patient is a 26 yr old R handed female with CP- spastic diplegia and has generalized anxiety disorder- On Sertraline for anxiety. Also has myofascial pain Here for f/u on diplegic CP and also here for TrP injections for pain control.   Goes back to school Monday.  School going well- thinks 4.0 GPA first semester.   Graduate in May; finish in July- but thinks will come back here for a few years after grad school.  Has to stay on SSI for a few more years- might benefit from loan reduction due to disability.   Pain- not too bad today- 3-4/10.   Has been 7-9/10 this week- hasn't been up long enough for pain to kick in so far this week.  Monday, had migraine vs tension HA and was really bad- 8/10 at that point.   Scalenes/side of neck are bothering her.  Asking about subscapularis lengthening.   Not doing Dantrolene like should-  takes the morning dose, but forgets the night dose forgetting 3x/week- alarm on phone doesn't help.   Has tried taking 200 mg all at once- no bad Sx's.  Doesn't need refills on dantrolene.   Plan: See if possible to move joystick to a more proximal position so doesn't have to reach as far for joystick. Might reduce R rhomboids trigger points.   2.  Due to CMP today- since on Dantrolene- LFTs  3. Can try Dantrolene 200 mg qam- and make sure no side effects since don't normally give 200 mg at once- but will try since she cannot take the 2nd dose pretty much all the time.   4.. Thinks has undiagnosed ADHD. Hasn't been tested prior.    5. Patient here for trigger point injections for  Consent done and on chart.  Cleaned areas with alcohol and injected using a 27 gauge 1.5 inch needle  Injected 4.5cc Using 1% Lidocaine with no EPI  Upper traps B/L  Levators Posterior scalenes Middle scalenes- B/L  Splenius Capitus- R only Pectoralis Major Rhomboids- 4x on R- and 2x on L Infraspinatus Teres Major/minor Thoracic paraspinals Lumbar paraspinals Other  injections-    Patient's level of pain prior was 4/10 Current level of pain after injections is- ~ 2/10  There was no bleeding or complications.  Patient was advised to drink a lot of water on day after injections to flush system Will have increased soreness for 12-48 hours after injections.  Can use Lidocaine patches the day AFTER injections Can use theracane on day of injections in places didn't inject Can use heating pad 4-6 hours AFTER injections  5. Went over scoliosis and curve sin cervical and lower thoracic spine-   6. F/U q 6weeks- 2 months. For Trigger point injections.    I spent a total of 32 minutes on visit- 8 minutes ion injections and 24 minutes on f/u.

## 2021-04-05 LAB — COMPREHENSIVE METABOLIC PANEL
ALT: 7 IU/L (ref 0–32)
AST: 14 IU/L (ref 0–40)
Albumin/Globulin Ratio: 1.8 (ref 1.2–2.2)
Albumin: 4.5 g/dL (ref 3.9–5.0)
Alkaline Phosphatase: 42 IU/L — ABNORMAL LOW (ref 44–121)
BUN/Creatinine Ratio: 14 (ref 9–23)
BUN: 8 mg/dL (ref 6–20)
Bilirubin Total: 0.3 mg/dL (ref 0.0–1.2)
CO2: 19 mmol/L — ABNORMAL LOW (ref 20–29)
Calcium: 9.5 mg/dL (ref 8.7–10.2)
Chloride: 105 mmol/L (ref 96–106)
Creatinine, Ser: 0.56 mg/dL — ABNORMAL LOW (ref 0.57–1.00)
Globulin, Total: 2.5 g/dL (ref 1.5–4.5)
Glucose: 79 mg/dL (ref 70–99)
Potassium: 4.3 mmol/L (ref 3.5–5.2)
Sodium: 140 mmol/L (ref 134–144)
Total Protein: 7 g/dL (ref 6.0–8.5)
eGFR: 131 mL/min/{1.73_m2} (ref >59)

## 2021-04-20 ENCOUNTER — Ambulatory Visit: Payer: Medicaid Other | Admitting: Physical Medicine and Rehabilitation

## 2021-05-18 ENCOUNTER — Encounter
Payer: Medicaid Other | Attending: Physical Medicine and Rehabilitation | Admitting: Physical Medicine and Rehabilitation

## 2021-05-18 ENCOUNTER — Encounter: Payer: Self-pay | Admitting: Physical Medicine and Rehabilitation

## 2021-05-18 ENCOUNTER — Other Ambulatory Visit: Payer: Self-pay

## 2021-05-18 VITALS — BP 126/83 | HR 86 | Temp 98.2°F | Ht <= 58 in

## 2021-05-18 DIAGNOSIS — M898X1 Other specified disorders of bone, shoulder: Secondary | ICD-10-CM | POA: Insufficient documentation

## 2021-05-18 DIAGNOSIS — M7918 Myalgia, other site: Secondary | ICD-10-CM | POA: Insufficient documentation

## 2021-05-18 DIAGNOSIS — G808 Other cerebral palsy: Secondary | ICD-10-CM | POA: Diagnosis not present

## 2021-05-18 DIAGNOSIS — R252 Cramp and spasm: Secondary | ICD-10-CM | POA: Insufficient documentation

## 2021-05-18 DIAGNOSIS — Z993 Dependence on wheelchair: Secondary | ICD-10-CM | POA: Diagnosis not present

## 2021-05-18 MED ORDER — METHOCARBAMOL 500 MG PO TABS: 500.0000 mg | ORAL_TABLET | Freq: Three times a day (TID) | ORAL | 3 refills | Status: DC | PRN

## 2021-05-18 NOTE — Patient Instructions (Signed)
Plan:  Sleep- Suggest benadryl LIQUID- and take a chidl's dose- if HAS to sleep and cannot go to sleep.    Sleep-  Or could try 1/2 of Robaxin/methocarbemol.    3. Migraines- Takes excedrin for migraines or tylenol-   Can try tylenol and a soda or coffee.    4. Rare usage of Robaxin/Methocarbemol- will rewrite/refill as needed.    5. Patient here for trigger point injections for  Consent done and on chart.  Cleaned areas with alcohol and injected using a 27 gauge 1.5 inch needle  Injected 4.5cc Using 1% Lidocaine with no EPI  Upper traps B/L  Levators- B/L  Posterior scalenes Middle scalenes- pt doesn't want done Splenius Capitus Pectoralis Major Rhomboids- L 2 and R 4x Infraspinatus Teres Major/minor Thoracic paraspinals Lumbar paraspinals Other injections-    Patient's level of pain prior was 4/10 Current level of pain after injections is- 4/10- a little looser  There was no bleeding or complications.  Patient was advised to drink a lot of water on day after injections to flush system Will have increased soreness for 12-48 hours after injections.  Can use Lidocaine patches the day AFTER injections Can use theracane on day of injections in places didn't inject Can use heating pad 4-6 hours AFTER injections  6. If needs a referral to Neuro, let me or PCP know for migraines- and we can do so- doesn't need appt to do so.   7. F/U 6 weeks. TrP injections

## 2021-05-18 NOTE — Progress Notes (Signed)
Patient is a 26 yr old R handed female with CP- spastic diplegia and has generalized anxiety disorder- On Sertraline for anxiety. Also has myofascial pain Here for f/u on diplegic CP and also here for TrP injections for pain control.   Back from school for this appointment.   Pain-  is better today but worse the last 2 weeks.  Horrific migraines in last 2 weeks.  One that lasted 36 hours.    Sleep schedule is also messed up.  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  Was up for 22 hours- and crashed and only slept for 3 hours.  Has a low tolerance for meds- so doesn't take anything-  Melatonin doesn't work for her- gives heartburn and nightmares.    Spasticity so much better since taking all dantrolene at the same time- so much better changed for the better.  No side effects.   Takes birth control continuously- and having problems getting it refilled.  Uses good Rx for 3rd month.  Since takes 3 months in 59months.  Period is her biggest trigger.     Plan:  Sleep- Suggest benadryl LIQUID- and take a chidl's dose- if HAS to sleep and cannot go to sleep.    Sleep-  Or could try 1/2 of Robaxin/methocarbemol.    3. Migraines- Takes excedrin for migraines or tylenol-   Can try tylenol and a soda or coffee.    4. Rare usage of Robaxin/Methocarbemol- will rewrite/refill as needed.    5. Patient here for trigger point injections for  Consent done and on chart.  Cleaned areas with alcohol and injected using a 27 gauge 1.5 inch needle  Injected 4.5cc Using 1% Lidocaine with no EPI  Upper traps B/L  Levators- B/L  Posterior scalenes Middle scalenes- pt doesn't want done Splenius Capitus Pectoralis Major Rhomboids- L 2 and R 4x Infraspinatus Teres Major/minor Thoracic paraspinals Lumbar paraspinals Other injections-    Patient's level of pain prior was 4/10 Current level of pain after injections is- 4/10- a little looser  There was  no bleeding or complications.  Patient was advised to drink a lot of water on day after injections to flush system Will have increased soreness for 12-48 hours after injections.  Can use Lidocaine patches the day AFTER injections Can use theracane on day of injections in places didn't inject Can use heating pad 4-6 hours AFTER injections  6. If needs a referral to Neuro, let me or PCP know for migraines- and we can do so- doesn't need appt to do so.   7. F/U 6 weeks. TrP injections

## 2021-07-20 ENCOUNTER — Ambulatory Visit: Payer: Medicaid Other | Admitting: Physical Medicine and Rehabilitation

## 2021-08-03 ENCOUNTER — Ambulatory Visit: Payer: Medicaid Other | Admitting: Physical Medicine and Rehabilitation

## 2021-08-06 ENCOUNTER — Encounter
Payer: Medicaid Other | Attending: Physical Medicine and Rehabilitation | Admitting: Physical Medicine and Rehabilitation

## 2021-08-06 ENCOUNTER — Encounter: Payer: Self-pay | Admitting: Physical Medicine and Rehabilitation

## 2021-08-06 VITALS — BP 130/81 | HR 88

## 2021-08-06 DIAGNOSIS — G43709 Chronic migraine without aura, not intractable, without status migrainosus: Secondary | ICD-10-CM | POA: Insufficient documentation

## 2021-08-06 DIAGNOSIS — M898X1 Other specified disorders of bone, shoulder: Secondary | ICD-10-CM | POA: Insufficient documentation

## 2021-08-06 DIAGNOSIS — G808 Other cerebral palsy: Secondary | ICD-10-CM | POA: Diagnosis present

## 2021-08-06 DIAGNOSIS — Z993 Dependence on wheelchair: Secondary | ICD-10-CM | POA: Diagnosis present

## 2021-08-06 DIAGNOSIS — M7918 Myalgia, other site: Secondary | ICD-10-CM | POA: Insufficient documentation

## 2021-08-06 DIAGNOSIS — G4452 New daily persistent headache (NDPH): Secondary | ICD-10-CM | POA: Insufficient documentation

## 2021-08-06 NOTE — Patient Instructions (Signed)
Plan: ? ?Patient here for trigger point injections for ? Consent done and on chart. ? ?Cleaned areas with alcohol and injected using a 27 gauge 1.5 inch needle ? ?Injected 4.5cc ?Using 1% Lidocaine with no EPI ? ?Upper traps B/L  ?Levators- B/L  ?Posterior scalenes ?Middle scalenes- B/L  ?Splenius Capitus ?Pectoralis Major- B/L ?Rhomboids- R only x3 ?Infraspinatus ?Teres Major/minor ?Thoracic paraspinals ?Lumbar paraspinals ?Other injections- B/L triceps-  ? ? ?Patient's level of pain prior was 4/10 ?Current level of pain after injections is 2/10 ? ?There was no bleeding or complications. ? ?Patient was advised to drink a lot of water on day after injections to flush system ?Will have increased soreness for 12-48 hours after injections.  ?Can use Lidocaine patches the day AFTER injections ?Can use theracane on day of injections in places didn't inject ?Can use heating pad 4-6 hours AFTER injections  ? ? ?2. Will refer to Neurology- for daily persistent HA's as well as migraine prevention.  ? ? ?3.  Con't dantrolene for spasticity- working well.  ? ? ?4. B/L foot pain when standing barefoot- cannot do rolling can- but can do theracane- and also stretch foot with toes upwards. BEFORE standing.  ? ?5. F/U in q6 weeks- trP injections and f/u on CP -double visit.  ?

## 2021-08-06 NOTE — Progress Notes (Addendum)
Patient is a 26 yr old R handed female with CP- spastic diplegia and has generalized anxiety disorder- On Sertraline for anxiety. ?Also has myofascial pain ?Here for f/u on diplegic CP and also here for TrP injections for pain control. ? ? ?2 classes- summer between her and master degree (18 credits right now).  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 ? ?Hasn't seen in awhile-  ?Pain is pretty ok right now- 4/10 right now ?However has been much worse the last few weeks.  ? ?Between HA's and shoulder pain averaged 7-8/10.  ?Also got random UTI last Month- the week before finals.  ?Late April. Didn't get PO ABX- cranberry juice and water and Azo to help with pain issues.  ? ?Having more HA's- daily persistent HA as well as migraines 1x/week.  ? ?Migraine- light sensitivity is the biggest issue with migraine as compared to regular HA. Also has some tinnitus and floaters as well. Only has sound sensitivity or nausea- also has smell sensitivity intermittently- sound/nausea occasionally- 1-2x/year that are so bad.   ?Standard HA'- no light sensitivity- but head hurts pretty bad- ?Thinks made worse by hunching over computer for master's degree and lack of TrP injections.  ? ? ? ? ?Plan: ? ?Patient here for trigger point injections for ? Consent done and on chart. ? ?Cleaned areas with alcohol and injected using a 27 gauge 1.5 inch needle ? ?Injected 4.5cc ?Using 1% Lidocaine with no EPI ? ?Upper traps B/L  ?Levators- B/L  ?Posterior scalenes ?Middle scalenes- B/L  ?Splenius Capitus ?Pectoralis Major- B/L ?Rhomboids- R only x3 ?Infraspinatus ?Teres Major/minor ?Thoracic paraspinals ?Lumbar paraspinals ?Other injections- B/L triceps-  ? ? ?Patient's level of pain prior was 4/10 ?Current  level of pain after injections is 2/10 ? ?There was no bleeding or complications. ? ?Patient was advised to drink a lot of water on day after injections to flush system ?Will have increased soreness for 12-48 hours after injections.  ?Can use Lidocaine patches the day AFTER injections ?Can use theracane on day of injections in places didn't inject ?Can use heating pad 4-6 hours AFTER injections  ? ? ?2. Will refer to Neurology- for daily persistent HA's as well as migraine prevention.  ? ? ?3.  Con't dantrolene for spasticity- working well.  ? ? ?4. B/L foot pain when standing barefoot- cannot do rolling can- but can do theracane- and also stretch foot with toes upwards. BEFORE standing.  ? ?5. F/U in q6 weeks- trP injections and f/u on CP -double visit.  ? ?6. Having more spasticity/cramping more- not sure if due to pain- if doesn't get better with improvement in pain, then will reassess Dantrolene dosing.  ? ?7. Need LFT's at next visit- after visit.  ? ?I spent a total of  36  minutes on total care today- >50% coordination of care- due to 10 minutes on injections and 26 minutes discussing ways to help foot pain; deal with migraines/daily HA's.  ? ?

## 2021-09-17 ENCOUNTER — Telehealth: Payer: Self-pay

## 2021-09-17 NOTE — Telephone Encounter (Signed)
I don't do Hep B toter because I don't know what to do with it- sorry- She can have PCP do both and get them to do LFT's as well Hep B titer- ML

## 2021-09-17 NOTE — Telephone Encounter (Signed)
Patient called stating she has an upcoming appt and has to have liver numbers check and she is needing a hep b titer. She wants to know can the hep b titer added to her order?

## 2021-09-17 NOTE — Telephone Encounter (Signed)
Notified. 

## 2021-09-18 ENCOUNTER — Telehealth: Payer: Self-pay

## 2021-09-18 MED ORDER — DANTROLENE SODIUM 100 MG PO CAPS: 100.0000 mg | ORAL_CAPSULE | Freq: Two times a day (BID) | ORAL | 3 refills | Status: DC

## 2021-09-18 NOTE — Telephone Encounter (Signed)
PA Started:

## 2021-09-18 NOTE — Addendum Note (Signed)
Addended by: Becky Sax on: 09/18/2021 02:09 PM   Modules accepted: Orders

## 2021-09-18 NOTE — Telephone Encounter (Signed)
PA for Dantrolene 100 MG has been created over the phone on 09/18/2021.Marland Kitchen

## 2021-09-21 ENCOUNTER — Encounter: Payer: Self-pay | Admitting: Physical Medicine and Rehabilitation

## 2021-09-21 ENCOUNTER — Encounter
Payer: Medicaid Other | Attending: Physical Medicine and Rehabilitation | Admitting: Physical Medicine and Rehabilitation

## 2021-09-21 VITALS — BP 126/85 | HR 88 | Ht <= 58 in | Wt 101.4 lb

## 2021-09-21 DIAGNOSIS — G808 Other cerebral palsy: Secondary | ICD-10-CM | POA: Diagnosis present

## 2021-09-21 DIAGNOSIS — R252 Cramp and spasm: Secondary | ICD-10-CM | POA: Diagnosis present

## 2021-09-21 DIAGNOSIS — M7918 Myalgia, other site: Secondary | ICD-10-CM | POA: Diagnosis present

## 2021-09-21 DIAGNOSIS — Z993 Dependence on wheelchair: Secondary | ICD-10-CM | POA: Diagnosis present

## 2021-11-06 ENCOUNTER — Ambulatory Visit: Payer: Medicaid Other | Admitting: Psychiatry

## 2021-11-14 ENCOUNTER — Ambulatory Visit: Payer: Medicaid Other | Admitting: Neurology

## 2021-11-15 ENCOUNTER — Encounter: Payer: Self-pay | Admitting: Neurology

## 2021-11-15 ENCOUNTER — Ambulatory Visit: Payer: Medicaid Other | Admitting: Neurology

## 2021-11-15 VITALS — BP 123/84 | HR 85

## 2021-11-15 DIAGNOSIS — G43709 Chronic migraine without aura, not intractable, without status migrainosus: Secondary | ICD-10-CM

## 2021-11-15 DIAGNOSIS — G801 Spastic diplegic cerebral palsy: Secondary | ICD-10-CM | POA: Diagnosis not present

## 2021-11-15 MED ORDER — DULOXETINE HCL 30 MG PO CPEP: 30.0000 mg | ORAL_CAPSULE | Freq: Every day | ORAL | 11 refills | Status: DC

## 2021-11-15 MED ORDER — SUMATRIPTAN SUCCINATE 25 MG PO TABS: 25.0000 mg | ORAL_TABLET | ORAL | 11 refills | Status: DC | PRN

## 2021-11-15 NOTE — Progress Notes (Signed)
Chief Complaint  Patient presents with   New Patient (Initial Visit)    Rm 13, alone  NP Internal referral for new onset headaches, increased daily HA's- and weekly migraines 2-3 migraines a week, reports daily headache       ASSESSMENT AND PLAN  DAYSHIA Wagner is a 26 y.o. female Cerebral palsy, spastic diplegia, with mild upper extremity involvement left worse than right Muscle spastic pain  Add on low-dose Cymbalta 30 mg daily  Chronic migraine headaches,  Educate her to avoid trigger, including excessive caffeine intake, irregular eating pattern  Imitrex 25 mg as needed  Return to clinic with nurse practitioner in 4 months  DIAGNOSTIC DATA (LABS, IMAGING, TESTING) - I reviewed patient records, labs, notes, testing and imaging myself where available.   MEDICAL HISTORY:  Melissa Wagner is a 26 year old female, seen in request by his primary care at [facility] Dr. Berline Chough, [facility] for evaluation of headache, initial evaluation was on November 15, 2021  I reviewed and summarized the referring note. PMHX. Cerebral palsy Anxiety since Sept 2022  She was born with cerebral palsy, involving lower extremity more than arm, gait abnormality, gradual worsening gait abnormality, spent most of the time in wheelchair now, She just graduated from Masters degree, last year was the first year she has to live outside of her home, it was quite stressful  She has a long history of migraine, as long as she could remember, sometimes preceded by visual aura, floaters in visual field, pounding headache with light noise sensitivity, nausea, lasting for 1 day,  While she was in graduate school, she was having migraines couple times a week, since she returned home, over the past 3 weeks, she also suffered 1-2 migraines,  She has been drinking excessive amount of caffeine to help her migraine, use Excedrin Migraine as needed with some help  PHYSICAL EXAM:   Vitals:   11/15/21 1107  BP: 123/84   Pulse: 85   Not recorded     There is no height or weight on file to calculate BMI.  PHYSICAL EXAMNIATION:  Gen: NAD, conversant, well nourised, well groomed                     Cardiovascular: Regular rate rhythm, no peripheral edema, warm, nontender. Eyes: Conjunctivae clear without exudates or hemorrhage Neck: Supple, no carotid bruits. Pulmonary: Clear to auscultation bilaterally   NEUROLOGICAL EXAM:  MENTAL STATUS: Speech/cognition: Awake, alert, oriented to history taking and casual conversation CRANIAL NERVES: CN II: Visual fields are full to confrontation. Pupils are round equal and briskly reactive to light. CN III, IV, VI: extraocular movement are normal. No ptosis. CN V: Facial sensation is intact to light touch CN VII: Face is symmetric with normal eye closure  CN VIII: Hearing is normal to causal conversation. CN IX, X: Phonation is normal. CN XI: Head turning and shoulder shrug are intact  MOTOR: Patient sitting in wheelchair, upper extremity strengths is fairly normal, mild posturing of left hand, fixation of left upper extremity on rapid rotating movement, hypoplasia of bilateral lower extremity, mild to moderate bilateral lower extremity muscle spasticity, mild proximal distal weakness  REFLEXES: Hyperreflexia  SENSORY: Intact to light touch, pinprick and vibratory sensation are intact in fingers and toes.  COORDINATION: There is no trunk or limb dysmetria noted.  GAIT/STANCE: deferred  REVIEW OF SYSTEMS:  Full 14 system review of systems performed and notable only for as above All other review of systems were negative.  ALLERGIES: Allergies  Allergen Reactions   Amoxicillin Rash    Other reaction(s): Unknown   Baclofen Nausea And Vomiting and Palpitations    Other reaction(s): Unknown    HOME MEDICATIONS: Current Outpatient Medications  Medication Sig Dispense Refill   acetaminophen (TYLENOL) 500 MG tablet Take 500 mg by mouth every 6  (six) hours as needed.     aspirin-acetaminophen-caffeine (EXCEDRIN MIGRAINE) 250-250-65 MG tablet Take by mouth every 6 (six) hours as needed for headache.     dantrolene (DANTRIUM) 100 MG capsule Take 1 capsule (100 mg total) by mouth 2 (two) times daily. For spasticity- was shorted last month- make sure not shorted- 180 capsule 3   DULoxetine (CYMBALTA) 30 MG capsule Take 1 capsule (30 mg total) by mouth daily. 30 capsule 11   escitalopram (LEXAPRO) 10 MG tablet Take 10 mg by mouth at bedtime.     methocarbamol (ROBAXIN) 500 MG tablet Take 1 tablet (500 mg total) by mouth every 8 (eight) hours as needed for muscle spasms (for muscle tightness that's NOT spasticity). 60 tablet 3   norethindrone-ethinyl estradiol 1/35 (ORTHO-NOVUM) tablet Take 1 tablet by mouth daily.     SUMAtriptan (IMITREX) 25 MG tablet Take 1 tablet (25 mg total) by mouth every 2 (two) hours as needed for migraine. May repeat in 2 hours if headache persists or recurs. 12 tablet 11   No current facility-administered medications for this visit.    PAST MEDICAL HISTORY: Past Medical History:  Diagnosis Date   Anxiety    Cerebral palsy (HCC)    Dysmenorrhea     PAST SURGICAL HISTORY: Past Surgical History:  Procedure Laterality Date   ADDUCTOR RELEASE     at age 89   dorsal rhizotomy     at age 26   HAMSTRING LENGTHENING     at the age 39    FAMILY HISTORY: Family History  Problem Relation Age of Onset   Hypertension Mother    Hyperlipidemia Mother    Obesity Mother    Hypothyroidism Mother    Hypertension Father    Hyperlipidemia Father     SOCIAL HISTORY: Social History   Socioeconomic History   Marital status: Single    Spouse name: Not on file   Number of children: Not on file   Years of education: Not on file   Highest education level: Not on file  Occupational History   Not on file  Tobacco Use   Smoking status: Never   Smokeless tobacco: Never  Vaping Use   Vaping Use: Never used   Substance and Sexual Activity   Alcohol use: Never   Drug use: Never   Sexual activity: Not on file  Other Topics Concern   Not on file  Social History Narrative   Not on file   Social Determinants of Health   Financial Resource Strain: Not on file  Food Insecurity: Not on file  Transportation Needs: Not on file  Physical Activity: Not on file  Stress: Not on file  Social Connections: Not on file  Intimate Partner Violence: Not on file      Levert Feinstein, M.D. Ph.D.  [facility] [address] [place], [place] [address] Ph: [phone] Fax: [phone]  CC:  Genice Rouge, MD [phone] [address] [place],  [place] [address]  [facility]  Migraine headaches history of migraine in July.

## 2021-11-19 ENCOUNTER — Encounter: Payer: Self-pay | Admitting: Physical Medicine and Rehabilitation

## 2021-11-19 ENCOUNTER — Encounter
Payer: Medicaid Other | Attending: Physical Medicine and Rehabilitation | Admitting: Physical Medicine and Rehabilitation

## 2021-11-19 VITALS — BP 128/88 | HR 90 | Ht <= 58 in

## 2021-11-19 DIAGNOSIS — G801 Spastic diplegic cerebral palsy: Secondary | ICD-10-CM | POA: Diagnosis present

## 2021-11-19 DIAGNOSIS — Z993 Dependence on wheelchair: Secondary | ICD-10-CM | POA: Diagnosis present

## 2021-11-19 DIAGNOSIS — M7918 Myalgia, other site: Secondary | ICD-10-CM | POA: Diagnosis present

## 2021-11-19 MED ORDER — PROMETHAZINE HCL 12.5 MG PO TABS: 12.5000 mg | ORAL_TABLET | Freq: Four times a day (QID) | ORAL | 1 refills | Status: AC | PRN

## 2021-11-19 MED ORDER — LIDOCAINE HCL 1 % IJ SOLN: 6.0000 mL | Freq: Once | INTRAMUSCULAR | Status: DC

## 2021-11-19 NOTE — Patient Instructions (Signed)
Plan: Went over that Cymbalta can cause nausea. Cymbalta could help L sciatica Sx's.    Giving Phenergan for possible nausea- 12.5 mg up to 3x/day as needed for nausea- #60 1 refill.   3. Patient here for trigger point injections for  Consent done and on chart.  Cleaned areas with alcohol and injected using a 27 gauge 1.5 inch needle  Injected 6cc Using 1% Lidocaine with no EPI  Upper traps B/L  Levators L only Posterior scalenes Middle scalenes- R only Splenius Capitus Pectoralis Major R only- and R lateral pec major as well Rhomboids B/L- 5x on R- 2x on L Infraspinatus Teres Major/minor Thoracic paraspinals Lumbar paraspinals Other injections-    Patient's level of pain prior was 7/10 but most of that is L hip back is 4/10 Current level of pain after injections is 3/10- nly thing that hurts is L hip  There was no bleeding or complications.  Patient was advised to drink a lot of water on day after injections to flush system Will have increased soreness for 12-48 hours after injections.  Can use Lidocaine patches the day AFTER injections Can use theracane on day of injections in places didn't inject Can use heating pad 4-6 hours AFTER injections   4. Don't need LFTs today. Q 6 months  5. Con't Dantrolene for pt- doesn't need refills.   6. Ortho referral- peds ortho for possible hamstring lengthening. Dr Josefine Class outside referral due to CP dx.   7. F/u- 6 weeks- double appt- CP Trp injections

## 2021-11-19 NOTE — Progress Notes (Signed)
Patient is a 26 yr old R handed female with CP- spastic diplegia and has generalized anxiety disorder- On Sertraline for anxiety. Also has myofascial pain Here for f/u on diplegic CP and also here for TrP injections for pain control.  A student loan forgiveness program- trying to get. 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   Got to Neurologist- got Rx's for Cymbalta for migraine prevention- starting tonight.   Also given Rx for imitrex for migraines.    Random L hip pain- starts in L groin- goes into L leg/radiates down L leg and into L buttock and skips knee, and down back  of L lower leg down into L foot/toes. Bottom of foot.     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   HA's aren't as bad as they were; but is still having them.  Having a consistent dull HA's all the time.   Migraines 3 in the last 3 weeks.  Has floaters, ear ringing, and Migraine occurred when Neurologist examined her.     Plan: Went over that Cymbalta can cause nausea. Cymbalta could help L sciatica Sx's.    Giving Phenergan for possible nausea- 12.5 mg up to 3x/day as needed for nausea- #60 1 refill.   3. Patient here for trigger point injections for  Consent done and on chart.  Cleaned areas with alcohol and injected using a 27 gauge 1.5 inch needle  Injected 6cc Using 1% Lidocaine with no EPI  Upper traps B/L  Levators L only Posterior scalenes Middle scalenes- R only Splenius Capitus Pectoralis Major R only- and R lateral pec major as well Rhomboids B/L- 5x on R- 2x on L Infraspinatus Teres Major/minor Thoracic paraspinals Lumbar paraspinals Other injections-    Patient's level of pain prior was 7/10 but most of that is L hip back is 4/10 Current level of pain after injections is 3/10- nly thing that hurts is L hip  There was no bleeding or complications.  Patient was advised to drink a lot of water on day after injections  to flush system Will have increased soreness for 12-48 hours after injections.  Can use Lidocaine patches the day AFTER injections Can use theracane on day of injections in places didn't inject Can use heating pad 4-6 hours AFTER injections   4. Don't need LFTs today. Q 6 months  5. Con't Dantrolene for pt- doesn't need refills.   6. Ortho referral- peds ortho for possible hamstring lengthening. Dr Josefine Class outside referral due to CP dx.   7. F/u- 6 weeks- double appt- CP Trp injections   I spent a total of  34  minutes on total care today- >50% coordination of care- due to TrP injections 10 minutes and discussion on how to treat migraines and tendon lengthening referral.

## 2022-01-09 ENCOUNTER — Ambulatory Visit: Payer: Medicaid Other | Admitting: Physical Medicine and Rehabilitation

## 2022-01-14 ENCOUNTER — Encounter: Payer: Self-pay | Admitting: Physical Medicine and Rehabilitation

## 2022-01-14 ENCOUNTER — Encounter
Payer: Medicaid Other | Attending: Physical Medicine and Rehabilitation | Admitting: Physical Medicine and Rehabilitation

## 2022-01-14 VITALS — BP 145/85 | HR 102 | Ht <= 58 in

## 2022-01-14 DIAGNOSIS — G801 Spastic diplegic cerebral palsy: Secondary | ICD-10-CM | POA: Insufficient documentation

## 2022-01-14 DIAGNOSIS — M7918 Myalgia, other site: Secondary | ICD-10-CM | POA: Insufficient documentation

## 2022-01-14 DIAGNOSIS — Z993 Dependence on wheelchair: Secondary | ICD-10-CM | POA: Insufficient documentation

## 2022-01-14 MED ORDER — LIDOCAINE HCL 1 % IJ SOLN
9.0000 mL | Freq: Once | INTRAMUSCULAR | Status: AC
  Administered 2022-01-14: 9 mL

## 2022-01-14 NOTE — Progress Notes (Signed)
Patient is a 26 yr old R handed female with CP- spastic diplegia and has generalized anxiety disorder- On Sertraline for anxiety. Also has myofascial pain Here for f/u on diplegic CP and also here for TrP injections for pain control.   Having more tightness than normal. Feels tight from elbows to head- so tight.     Has only had 1 migraine in last month- on Duloxetine. Mother is concerned Duloxetine is causing looser stools. Ever since had wisdom teeth out  in September- 7th- and 8th is when loose stools started. Mother is convinces has to stop Duloxetine.   Having some incontinence- but mainly just loose stools.  But cnanot trust when she "farts".  Will pass with stool now.   Had nerve pain in R hip/RLE- but is completely gone on Duloxetine.  Usually bothers when in manual w/c.    When sits up at 90 degrees, hurts R shoulder more- so likes to sit back in tilt.  Got new w/c cushion- loves it!!! Has a new Ulice Dash fusion cushion.   Anxiety is SO much better since started Duloxetine- at least 25% reduction in anxiety now! No anic attacks for last 2+ weeks  Forgot to take Dantrolene today this AM.  Getting bars for toilet in today- so now independent- going to toilet.   Plan: Suggest seeing GI for loose stools- yes Dantrolene can add to it- but except Duloxetine- if doesn't improve in the next month or so.   2. Patient here for trigger point injections for  Consent done and on chart.  Cleaned areas with alcohol and injected using a 27 gauge 1.5 inch needle  Injected  7cc Using 1% Lidocaine with no EPI  Upper traps B/L  Levators Posterior scalenes Middle scalenes- B/L  Splenius Capitus- B/L  Pectoralis Major- B/L  Rhomboids- 4x on R side and 1x on L side Infraspinatus Teres Major/minor B/L  Thoracic paraspinals Lumbar paraspinals Other injections-    Patient's level of pain prior was 6/10 Current level of pain after injections is- 2/10  There was no bleeding or  complications.  Patient was advised to drink a lot of water on day after injections to flush system Will have increased soreness for 12-48 hours after injections.  Can use Lidocaine patches the day AFTER injections Can use theracane on day of injections in places didn't inject Can use heating pad 4-6 hours AFTER injections    3. Continue Dantrolene- doesn't need refills yet.    4. Has consult with ortho Surgeon on 01/22/22- at [facility]- to possible improve ROM in legs.    5. Con't Duloxetine  per Neuro- but working for anxiety, migraines and pain.    6. Due for CMP- in December 2023.   7. F/U- since Duloxetine and Dantrolene have concerns with liver function, suggest no more than 2 grams/Tylenol/day.   8. Plans on asking PCP to check thyroid levels. Mother has hypothyroidism.   9. Do chair yoga- on youtube! For increased tightness. And don't just 'sit on sofa"- move more.   I spent a total of 27   minutes on total care today- >50% coordination of care- due to 10 minutes on trigger point injections and 17 minutes on discussion of bowels, ortho surgery appt and discussing medical issues.

## 2022-01-14 NOTE — Addendum Note (Signed)
Addended by: Caro Hight on: 01/14/2022 01:45 PM   Modules accepted: Orders

## 2022-01-14 NOTE — Patient Instructions (Signed)
Plan: Suggest seeing GI for loose stools- yes Dantrolene can add to it- but except Duloxetine- if doesn't improve in the next month or so.   2. Patient here for trigger point injections for  Consent done and on chart.  Cleaned areas with alcohol and injected using a 27 gauge 1.5 inch needle  Injected  7cc Using 1% Lidocaine with no EPI  Upper traps B/L  Levators Posterior scalenes Middle scalenes- B/L  Splenius Capitus- B/L  Pectoralis Major- B/L  Rhomboids- 4x on R side and 1x on L side Infraspinatus Teres Major/minor B/L  Thoracic paraspinals Lumbar paraspinals Other injections-    Patient's level of pain prior was 6/10 Current level of pain after injections is- 2/10  There was no bleeding or complications.  Patient was advised to drink a lot of water on day after injections to flush system Will have increased soreness for 12-48 hours after injections.  Can use Lidocaine patches the day AFTER injections Can use theracane on day of injections in places didn't inject Can use heating pad 4-6 hours AFTER injections    3. Continue Dantrolene- doesn't need refills yet.    4. Has consult with ortho Surgeon on 01/22/22- at [facility]- to possible improve ROM in legs.    5. Con't Duloxetine  per Neuro- but working for anxiety, migraines and pain.    6. Due for CMP- in December 2023.   7. F/U- since Duloxetine and Dantrolene have concerns with liver function, suggst no more than 2 grams/Tylenol/day.   8. Plans on asking PCP to check thyroid levels. Mother has hypothyroidism.

## 2022-01-24 DIAGNOSIS — M62451 Contracture of muscle, right thigh: Secondary | ICD-10-CM | POA: Insufficient documentation

## 2022-01-24 DIAGNOSIS — M24551 Contracture, right hip: Secondary | ICD-10-CM | POA: Insufficient documentation

## 2022-02-16 IMAGING — DX DG CERVICAL SPINE 2 OR 3 VIEWS
2 series · 2 of 2 positions shown · non-contrast
Comparison: No prior cervical spine exams.

CLINICAL DATA: Diplegic cerebral palsy. Wheelchair dependence.
Juvenile idiopathic scoliosis of cervicothoracic region. Scoliosis
of upper thoracic.

EXAM:
CERVICAL SPINE - 2-3 VIEW

[dg cervical spine 2 or 3 views (1 of 2)]
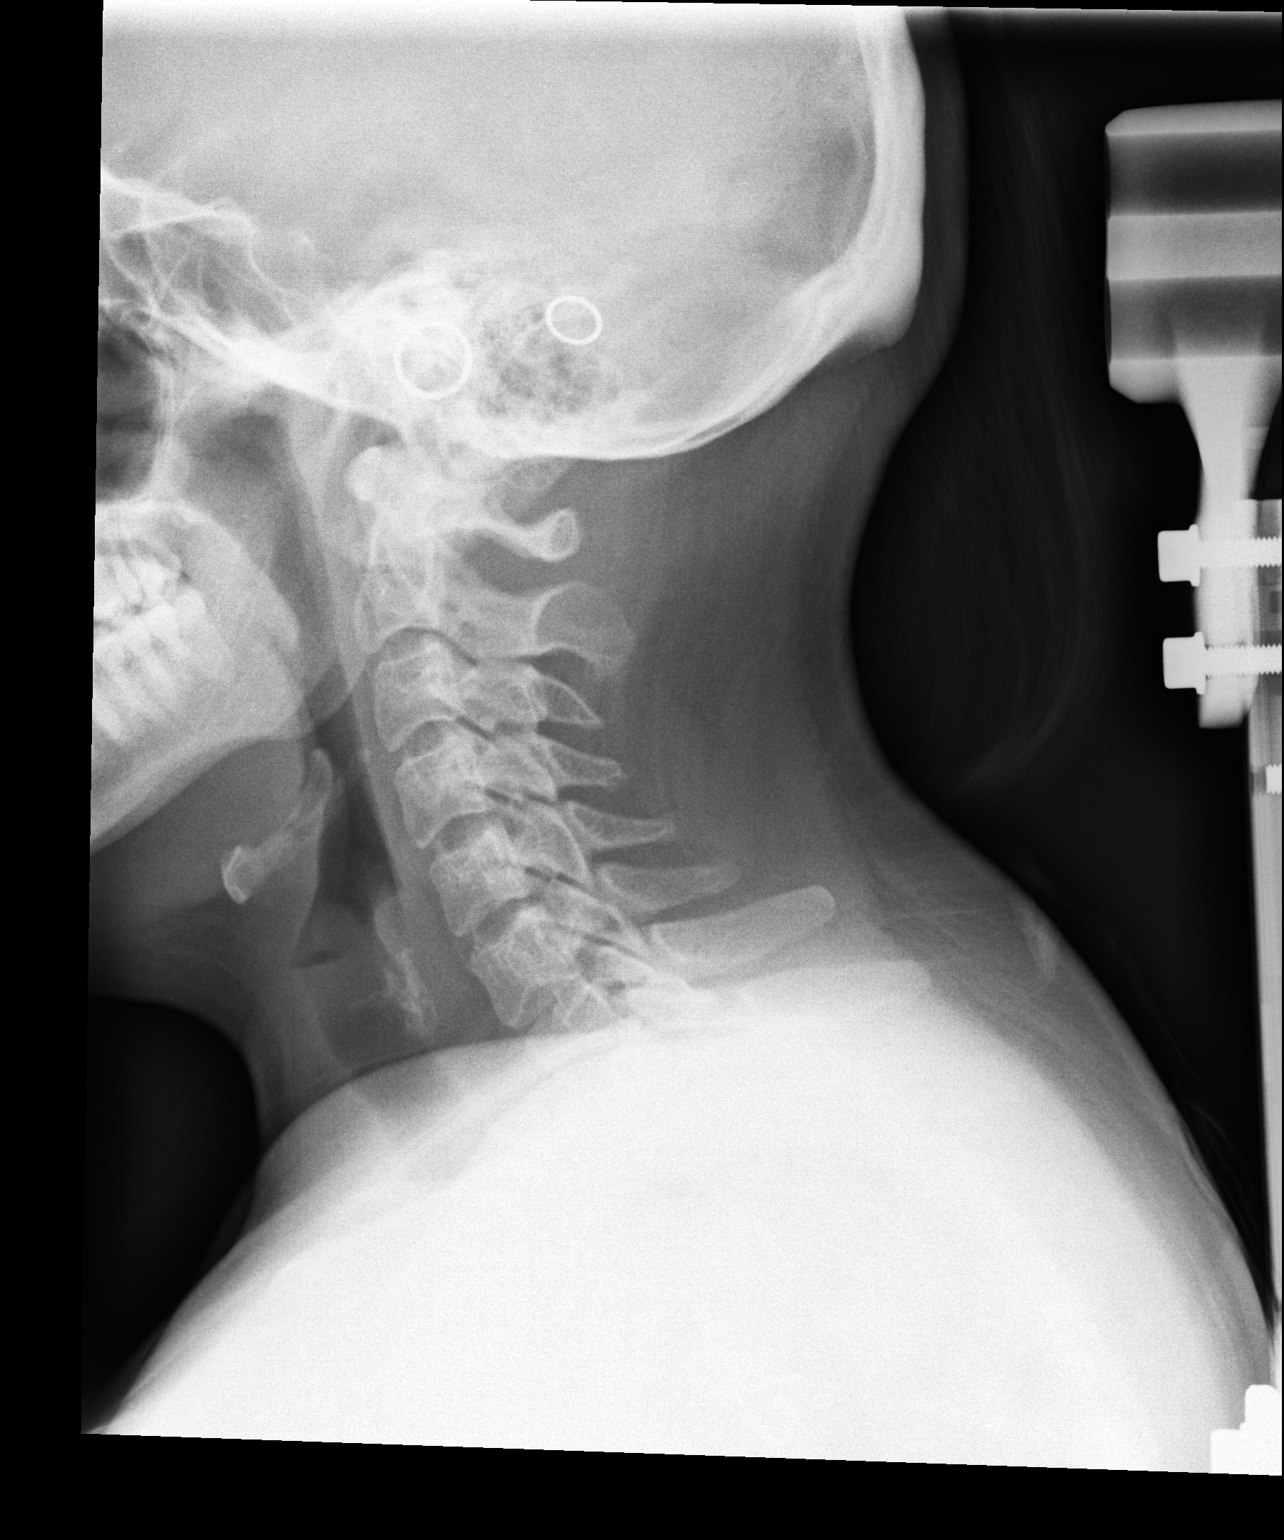

[dg cervical spine 2 or 3 views (2 of 2)]
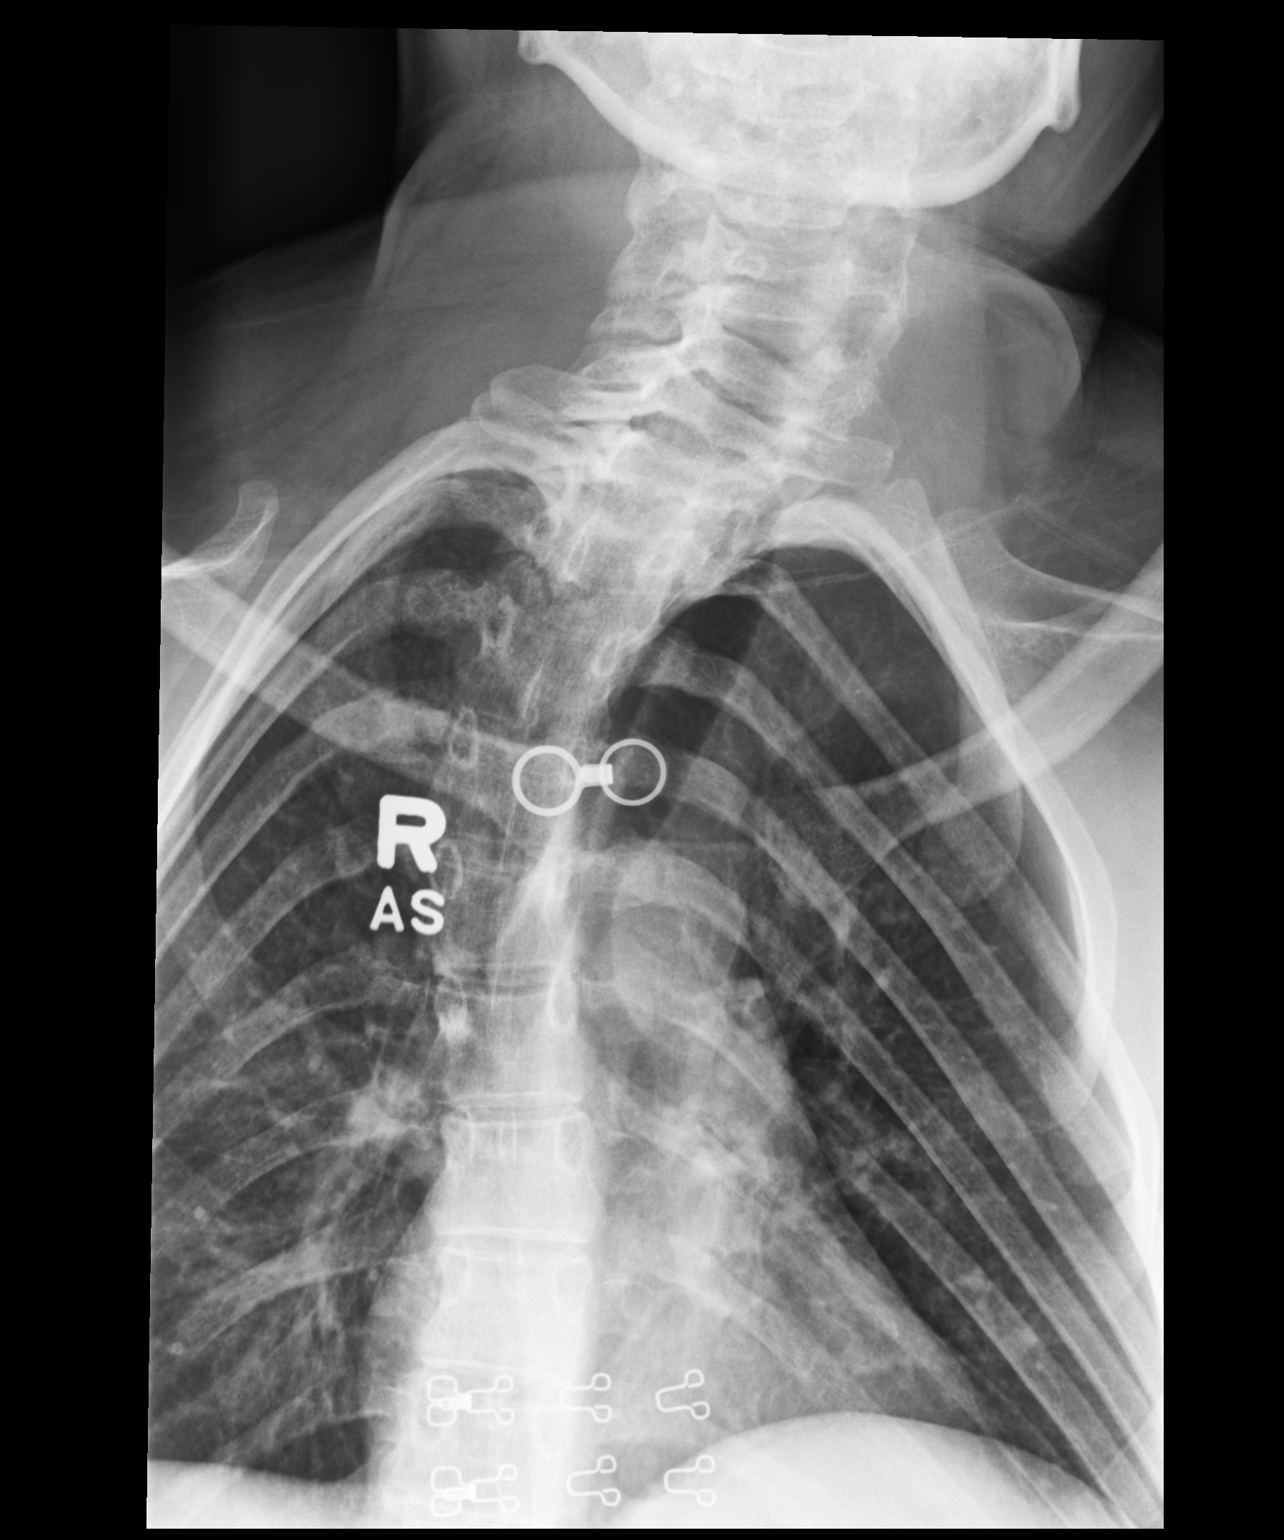

[2 of 2 positions shown; findings below may reference images not displayed]

FINDINGS: Performed in conjunction with radiographs of the thoracolumbar
spine. AP view excludes the upper aspect of the cervical spine.
There is dextroscoliotic curvature of the cervicothoracic junction.
This is not significantly changed when compared with 02/07/2017
chest radiograph. Normal alignment on the lateral view. Normal
vertebral body heights. No intrinsic bone abnormality.
IMPRESSION: 1. Dextroscoliotic curvature of the cervicothoracic junction,
unchanged when compared with 02/07/2017 chest radiograph.
2. AP view excludes the upper aspect of the cervical spine.

## 2022-02-16 IMAGING — DX DG THORACIC SPINE 1V
2 series · 2 of 2 positions shown · non-contrast
Comparison: Chest radiograph 02/07/2017

CLINICAL DATA: Diplegic cerebral palsy. Wheelchair dependence.
Juvenile idiopathic scoliosis of cervicothoracic region. Right
scapula/right paraspinal appears like has more scoliosis. Curving
more to the right.

EXAM:
THORACIC SPINE 1V

[dg thoracic spine 1 view (1 of 2)]
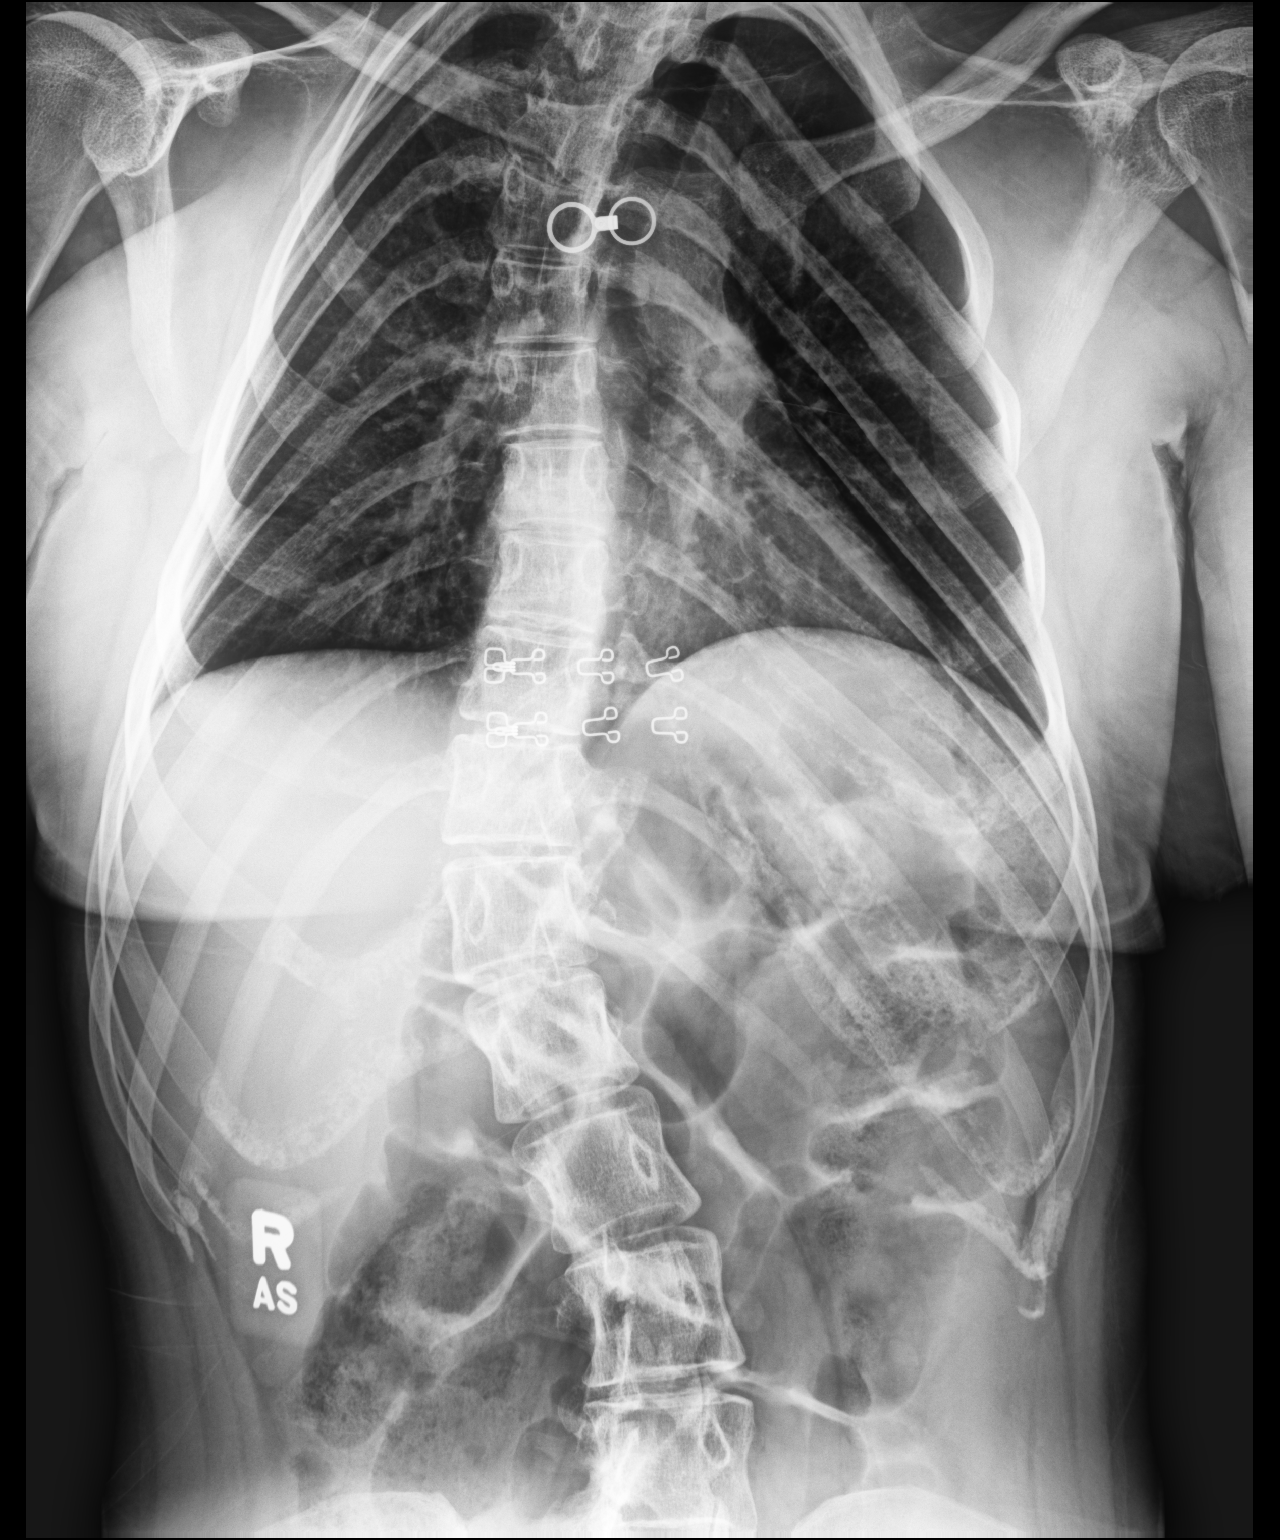

[dg thoracic spine 1 view (2 of 2)]
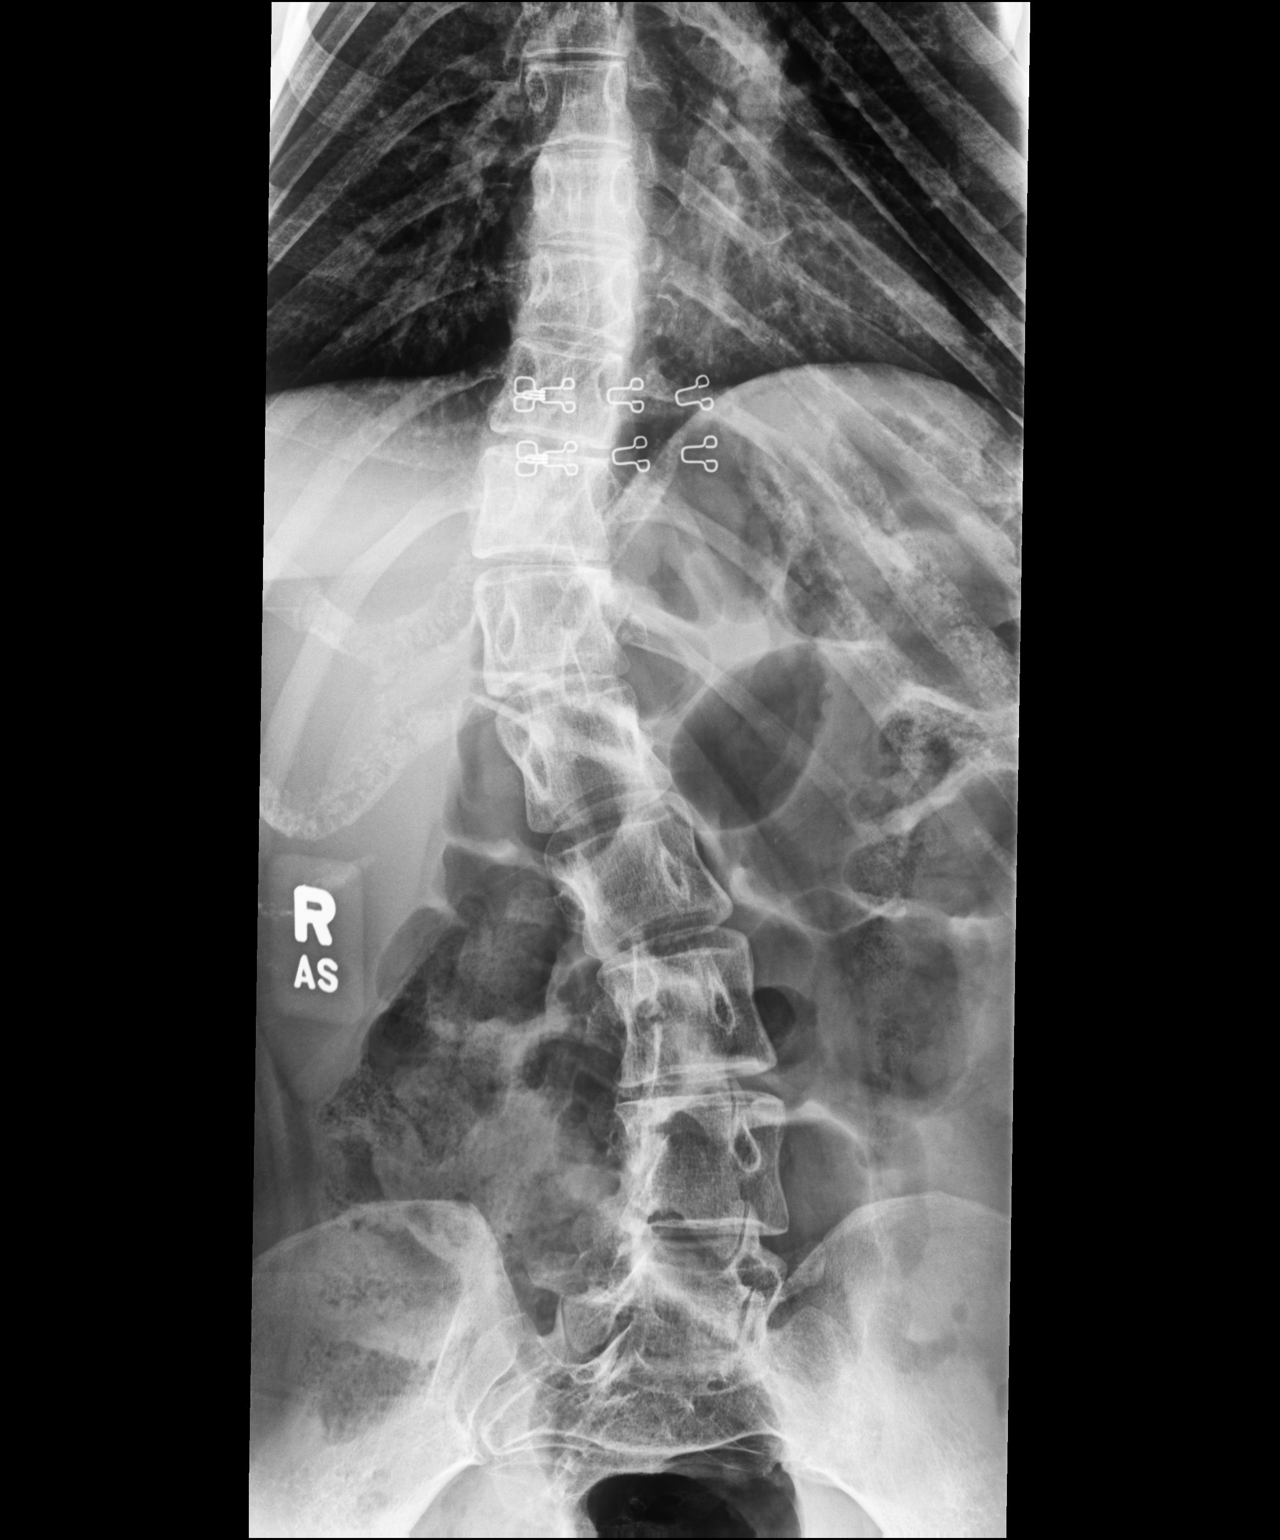

[2 of 2 positions shown; findings below may reference images not displayed]

FINDINGS: There is dextroscoliotic curvature of the cervicothoracic junction,
approximately 36 degrees when measured from C7 to T6. There is also
dextroscoliotic curvature of the thoracolumbar junction,
approximately 32 degrees when measured from T10-L1. Levo scoliotic
curvature of the lumbar spine of 18 degrees when measured from
L2-L4. When compared with prior chest radiograph, there is no
significant interval change in the degree of scoliotic curvature.
There is no definite intrinsic vertebral abnormality.
IMPRESSION: 1. Scoliotic curvature of the spine, as described above.
2. When compared with prior chest radiograph of 5705, there is no
significant interval change.

## 2022-02-25 ENCOUNTER — Encounter: Payer: Medicaid Other | Admitting: Physical Medicine and Rehabilitation

## 2022-02-25 ENCOUNTER — Ambulatory Visit: Payer: Medicaid Other | Admitting: Physical Medicine and Rehabilitation

## 2022-03-01 ENCOUNTER — Ambulatory Visit: Payer: Medicaid Other | Admitting: Physical Medicine and Rehabilitation

## 2022-03-21 ENCOUNTER — Encounter: Payer: Self-pay | Admitting: Neurology

## 2022-03-21 ENCOUNTER — Ambulatory Visit: Payer: Medicaid Other | Admitting: Neurology

## 2022-03-21 VITALS — BP 134/75 | HR 85 | Ht <= 58 in

## 2022-03-21 DIAGNOSIS — G801 Spastic diplegic cerebral palsy: Secondary | ICD-10-CM | POA: Diagnosis not present

## 2022-03-21 DIAGNOSIS — G43709 Chronic migraine without aura, not intractable, without status migrainosus: Secondary | ICD-10-CM | POA: Diagnosis not present

## 2022-03-21 MED ORDER — DULOXETINE HCL 60 MG PO CPEP: 60.0000 mg | ORAL_CAPSULE | Freq: Every day | ORAL | 3 refills | Status: DC

## 2022-03-21 MED ORDER — RIZATRIPTAN BENZOATE 10 MG PO TBDP: 10.0000 mg | ORAL_TABLET | ORAL | 6 refills | Status: DC | PRN

## 2022-03-21 MED ORDER — RIZATRIPTAN BENZOATE 5 MG PO TBDP: 5.0000 mg | ORAL_TABLET | ORAL | 6 refills | Status: DC | PRN

## 2022-03-21 NOTE — Progress Notes (Signed)
Chief Complaint  Patient presents with   Follow-up    Rm 14. Alone. Has 1-2 migraines per week due to missing her pain management injections. Before the missed appointment migraines were monthly. Sees significant benefit with Cymbalta, interested in increase. C/o muscle spasms.      ASSESSMENT AND PLAN  Melissa Wagner is a 26 y.o. female Cerebral palsy, spastic diplegia, with mild upper extremity involvement left worse than right Muscle spastic pain  low-dose Cymbalta 30 mg daily was helpful, will increase to 60 mg daily  Chronic migraine headaches,  Maxalt 5 mg as needed may mix with Phenergan, NSAIDs for prolonged severe headaches,    Return to clinic with nurse practitioner in 4 months, if she desired to proceed with botulism toxin injection, may contact our office earlier for prior authorization process  DIAGNOSTIC DATA (LABS, IMAGING, TESTING) - I reviewed patient records, labs, notes, testing and imaging myself where available.   MEDICAL HISTORY:  Melissa Wagner is a 26 year old female, seen in request by his primary care at [facility] Dr. Berline Chough, [facility] for evaluation of headache, initial evaluation was on November 15, 2021  I reviewed and summarized the referring note. PMHX. Cerebral palsy Anxiety since Sept 2022  She was born with cerebral palsy, involving lower extremity more than arm, gait abnormality, gradual worsening gait abnormality, spent most of the time in wheelchair now, She just graduated from Masters degree, last year was the first year she has to live outside of her home, it was quite stressful  She has a long history of migraine, as long as she could remember, sometimes preceded by visual aura, floaters in visual field, pounding headache with light noise sensitivity, nausea, lasting for 1 day,  While she was in graduate school, she was having migraines couple times a week, since she returned home, over the past 3 weeks, she also suffered 1-2  migraines,  She has been drinking excessive amount of caffeine to help her migraine, use Excedrin Migraine as needed with some help  UPDATE Mar 21 2022: She complains of increased migraine headache over the past few months, has missed her every 6 weeks trigger point injection, now complains of worsening muscle spasm along her spine, neck, often triggered her migraine headaches  She cannot also get a supply of her dantrolene from local pharmacy due to lack of supply,  She complains of increased diffuse body spasm, I offered multiple medication choice including baclofen, could not tolerated in the past, tizanidine, worried about the side effect  She is having migraine headache 1-2 times each week, stated insurance would not pay for Imitrex, now taking over-the-counter Excedrin Migraine once or twice every week as needed,  I also offered Botox injection for potential muscle spasm, migraine prevention, she reported she has tried Botox for lower extremity spasticity when she was 62 or 26 years old, has build up tolerance for it  She has pending orthopedic surgical procedure for lower extremity spasticity, tendon contraction PHYSICAL EXAM:   Vitals:   03/21/22 1455  BP: 134/75  Pulse: 85  Height: 4\' 3"  (1.295 m)   Not recorded     Body mass index is 27.41 kg/m.  PHYSICAL EXAMNIATION:  Gen: NAD, conversant, well nourised, well groomed                     Cardiovascular: Regular rate rhythm, no peripheral edema, warm, nontender. Eyes: Conjunctivae clear without exudates or hemorrhage Neck: Supple, no carotid bruits. Pulmonary: Clear to  auscultation bilaterally   NEUROLOGICAL EXAM:  MENTAL STATUS: Speech/cognition: Awake, alert, oriented to history taking and casual conversation CRANIAL NERVES: CN II: Visual fields are full to confrontation. Pupils are round equal and briskly reactive to light. CN III, IV, VI: extraocular movement are normal. No ptosis. CN V: Facial sensation is  intact to light touch CN VII: Face is symmetric with normal eye closure  CN VIII: Hearing is normal to causal conversation. CN IX, X: Phonation is normal. CN XI: Head turning and shoulder shrug are intact  MOTOR: Patient sitting in wheelchair, upper extremity strengths is fairly normal, mild posturing of left hand, fixation of left upper extremity on rapid rotating movement, hypoplasia of bilateral lower extremity, mild to moderate bilateral lower extremity muscle spasticity, mild proximal distal weakness, able to move bilateral lower extremities against gravity  REFLEXES: Hyperreflexia  SENSORY: Intact to light touch,  COORDINATION: There is no trunk or limb dysmetria noted.  GAIT/STANCE: deferred  REVIEW OF SYSTEMS:  Full 14 system review of systems performed and notable only for as above All other review of systems were negative.   ALLERGIES: Allergies  Allergen Reactions   Amoxicillin Rash    Other reaction(s): Unknown   Baclofen Nausea And Vomiting and Palpitations    Other reaction(s): Unknown    HOME MEDICATIONS: Current Outpatient Medications  Medication Sig Dispense Refill   acetaminophen (TYLENOL) 500 MG tablet Take 500 mg by mouth every 6 (six) hours as needed.     aspirin-acetaminophen-caffeine (EXCEDRIN MIGRAINE) 250-250-65 MG tablet Take by mouth every 6 (six) hours as needed for headache.     dantrolene (DANTRIUM) 100 MG capsule Take 1 capsule (100 mg total) by mouth 2 (two) times daily. For spasticity- was shorted last month- make sure not shorted- 180 capsule 3   DULoxetine (CYMBALTA) 30 MG capsule Take 1 capsule (30 mg total) by mouth daily. 30 capsule 11   escitalopram (LEXAPRO) 10 MG tablet Take 10 mg by mouth at bedtime.     methocarbamol (ROBAXIN) 500 MG tablet Take 1 tablet (500 mg total) by mouth every 8 (eight) hours as needed for muscle spasms (for muscle tightness that's NOT spasticity). 60 tablet 3   norethindrone-ethinyl estradiol 1/35  (ORTHO-NOVUM) tablet Take 1 tablet by mouth daily.     promethazine (PHENERGAN) 12.5 MG tablet Take 1 tablet (12.5 mg total) by mouth every 6 (six) hours as needed for nausea or vomiting. 60 tablet 1   No current facility-administered medications for this visit.    PAST MEDICAL HISTORY: Past Medical History:  Diagnosis Date   Anxiety    Cerebral palsy (HCC)    Dysmenorrhea     PAST SURGICAL HISTORY: Past Surgical History:  Procedure Laterality Date   ADDUCTOR RELEASE     at age 35   dorsal rhizotomy     at age 32   HAMSTRING LENGTHENING     at the age 24    FAMILY HISTORY: Family History  Problem Relation Age of Onset   Hypertension Mother    Hyperlipidemia Mother    Obesity Mother    Hypothyroidism Mother    Hypertension Father    Hyperlipidemia Father     SOCIAL HISTORY: Social History   Socioeconomic History   Marital status: Single    Spouse name: Not on file   Number of children: Not on file   Years of education: Not on file   Highest education level: Not on file  Occupational History   Not on file  Tobacco Use   Smoking status: Never   Smokeless tobacco: Never  Vaping Use   Vaping Use: Never used  Substance and Sexual Activity   Alcohol use: Never   Drug use: Never   Sexual activity: Not on file  Other Topics Concern   Not on file  Social History Narrative   Not on file   Social Determinants of Health   Financial Resource Strain: Not on file  Food Insecurity: Not on file  Transportation Needs: Not on file  Physical Activity: Not on file  Stress: Not on file  Social Connections: Not on file  Intimate Partner Violence: Not on file      Levert Feinstein, M.D. Ph.D.  [facility] [address] [place], [place] [address] Ph: [phone] Fax: [phone]  CC:  [facility] [address] [place],  [place] [address]  [facility]  Migraine headaches history  of migraine in July.

## 2022-04-19 ENCOUNTER — Encounter: Payer: Self-pay | Admitting: Physical Medicine and Rehabilitation

## 2022-04-19 ENCOUNTER — Encounter
Payer: Medicaid Other | Attending: Physical Medicine and Rehabilitation | Admitting: Physical Medicine and Rehabilitation

## 2022-04-19 VITALS — BP 129/80 | HR 86 | Temp 97.8°F | Ht <= 58 in

## 2022-04-19 DIAGNOSIS — Z993 Dependence on wheelchair: Secondary | ICD-10-CM | POA: Diagnosis not present

## 2022-04-19 DIAGNOSIS — M7918 Myalgia, other site: Secondary | ICD-10-CM | POA: Insufficient documentation

## 2022-04-19 DIAGNOSIS — R252 Cramp and spasm: Secondary | ICD-10-CM | POA: Diagnosis not present

## 2022-04-19 DIAGNOSIS — G808 Other cerebral palsy: Secondary | ICD-10-CM | POA: Insufficient documentation

## 2022-04-19 DIAGNOSIS — G43709 Chronic migraine without aura, not intractable, without status migrainosus: Secondary | ICD-10-CM | POA: Diagnosis present

## 2022-04-19 MED ORDER — LIDOCAINE HCL 1 % IJ SOLN: 3.0000 mL | Freq: Once | INTRAMUSCULAR | Status: AC

## 2022-04-19 NOTE — Progress Notes (Signed)
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 Also has myofascial pain Here for f/u on diplegic CP and also here for TrP injections for pain control.           Using manual w/c since hers is stuck in mom's office- since house so small.   Manual w/c- Armrests are different heights- 1 bolt is sheared off on L and   No major issues.  No luck trying to find volunteer work-  Nothing to get out of house more Having more pain. Hasn't seen me since October-   Migraines a little worse in last couple of months, since so tight- 1x/week for past couple of months.  Lasting for multiple days when occurs.   Put on new rescue medicine- Maxalt-  Was worried about prolonged QT - that medicine could help.   Taking Cymbalta 60 mg daily- just started new dose start of year- but hasn't been great about taking the dose at night, since bad about taking night meds in general.   On Lexapro 10 mg daily- cannot decrease per psychiatry.    Having sharp stabbing pain lately in low back- which is new Sciatica also flaring up since in manual w/c more.   Social Hx: Going "insane" being stuck in house for weeks.  Cannot do her own shower transfer- so when mother threw her back out and couldn't do shower transfer. January 1, was mother throwing back out    hamstrings, adductors and hip flexors and botox.  Surgery scheduled in February 21st-    Plan:  Patient here for trigger point injections for  Consent done and on chart.  Cleaned areas with alcohol and injected using a 27 gauge 1.5 inch needle  Injected  Using 1% Lidocaine with no EPI  Upper traps B/L  Levators B/L  Posterior scalenes Middle scalenes B/L  Splenius Capitus Pectoralis Major Rhomboids R side x4 Infraspinatus Teres Major/minor Thoracic paraspinals Lumbar paraspinals Other injections-    There was no bleeding or complications.  Patient  was advised to drink a lot of water on day after injections to flush system Will have increased soreness for 12-48 hours after injections.  Can use Lidocaine patches the day AFTER injections Can use theracane on day of injections in places didn't inject Can use heating pad 4-6 hours AFTER injections   2. CMP for f/u on LFTs for spasticity- ordered for today- has to have Dantrolene since has anaphylaxis with baclofen.   3. Discussed LE spasticity- getting surgery 2/21.   4. Didn't get thyroid levels checked- hasn't seen PCP yet. Needs to do so.   5. Heart burn bad- needs to call PCP- d/w pt how to deal with PCP's meds- prilosec and other PPI's are over the counter at lower doses.     6. F/U- 6 weeks- double appt- Trigger point injections- and f/u on spasticity   I spent a total of   33 minutes on total care today- >50% coordination of care- due to 10 minutes for injections- 23 minutes on f/u discussing spasticity, migraines and coming up surgery.

## 2022-04-19 NOTE — Patient Instructions (Signed)
Plan:  Patient here for trigger point injections for  Consent done and on chart.  Cleaned areas with alcohol and injected using a 27 gauge 1.5 inch needle  Injected  Using 1% Lidocaine with no EPI  Upper traps B/L  Levators B/L  Posterior scalenes Middle scalenes B/L  Splenius Capitus Pectoralis Major Rhomboids R side x4 Infraspinatus Teres Major/minor Thoracic paraspinals Lumbar paraspinals Other injections-    There was no bleeding or complications.  Patient was advised to drink a lot of water on day after injections to flush system Will have increased soreness for 12-48 hours after injections.  Can use Lidocaine patches the day AFTER injections Can use theracane on day of injections in places didn't inject Can use heating pad 4-6 hours AFTER injections   2. CMP for f/u on LFTs for spasticity- ordered for today- has to have Dantrolene since has anaphylaxis with baclofen.   3. Discussed LE spasticity- getting surgery 2/21.   4. Didn't get thyroid levels checked- hasn't seen PCP yet. Needs to do so.   5. Heart burn bad- needs to call PCP- d/w pt how to deal with PCP's meds- prilosec and other PPI's are over the counter at lower doses.     6. F/U- 6 weeks- double appt- Trigger point injections- and f/u on spasticity

## 2022-04-20 LAB — COMPREHENSIVE METABOLIC PANEL
ALT: 5 IU/L (ref 0–32)
AST: 13 IU/L (ref 0–40)
Albumin/Globulin Ratio: 1.7 (ref 1.2–2.2)
Albumin: 4.3 g/dL (ref 4.0–5.0)
Alkaline Phosphatase: 44 IU/L (ref 44–121)
BUN/Creatinine Ratio: 11 (ref 9–23)
BUN: 7 mg/dL (ref 6–20)
Bilirubin Total: 0.2 mg/dL (ref 0.0–1.2)
CO2: 17 mmol/L — ABNORMAL LOW (ref 20–29)
Calcium: 9.3 mg/dL (ref 8.7–10.2)
Chloride: 104 mmol/L (ref 96–106)
Creatinine, Ser: 0.61 mg/dL (ref 0.57–1.00)
Globulin, Total: 2.6 g/dL (ref 1.5–4.5)
Glucose: 98 mg/dL (ref 70–99)
Potassium: 3.9 mmol/L (ref 3.5–5.2)
Sodium: 138 mmol/L (ref 134–144)
Total Protein: 6.9 g/dL (ref 6.0–8.5)
eGFR: 128 mL/min/{1.73_m2} (ref >59)

## 2022-05-31 ENCOUNTER — Encounter: Payer: PRIVATE HEALTH INSURANCE | Admitting: Physical Medicine and Rehabilitation

## 2022-07-11 ENCOUNTER — Encounter: Payer: Self-pay | Admitting: Physical Therapy

## 2022-07-11 ENCOUNTER — Ambulatory Visit: Payer: Medicaid Other | Attending: Orthopedic Surgery | Admitting: Physical Therapy

## 2022-07-11 DIAGNOSIS — R252 Cramp and spasm: Secondary | ICD-10-CM | POA: Insufficient documentation

## 2022-07-11 DIAGNOSIS — M62830 Muscle spasm of back: Secondary | ICD-10-CM | POA: Diagnosis present

## 2022-07-11 DIAGNOSIS — R531 Weakness: Secondary | ICD-10-CM | POA: Insufficient documentation

## 2022-07-11 NOTE — Therapy (Signed)
OUTPATIENT PHYSICAL THERAPY LOWER EXTREMITY EVALUATION   Patient Name: Melissa Wagner MRN: [id number] DOB:01/14/1996, 27 y.o., female Today's Date: 07/11/2022  END OF SESSION:  PT End of Session - 07/11/22 1612     Visit Number 1    Number of Visits 4    Date for PT Re-Evaluation 10/10/22    Authorization Type [facility]    PT Start Time 1610    PT Stop Time 1700    PT Time Calculation (min) 50 min    Activity Tolerance Patient tolerated treatment well    Behavior During Therapy WFL for tasks assessed/performed             Past Medical History:  Diagnosis Date   Anxiety    Cerebral palsy    Dysmenorrhea    Past Surgical History:  Procedure Laterality Date   ADDUCTOR RELEASE     at age 6   dorsal rhizotomy     at age 89   HAMSTRING LENGTHENING     at the age 38   Patient Active Problem List   Diagnosis Date Noted   Contracture of both hamstrings 01/24/2022   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/24/2022   Chronic migraine w/o aura w/o status migrainosus, not intractable 11/15/2021   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   Chronic migraine without aura without status migrainosus, not intractable 08/06/2021   Spasticity 10/11/2019   Jaw pain 07/07/2019   Wheelchair dependence 06/18/2019   Diplegic cerebral palsy 06/18/2019   Myofascial pain 06/18/2019   Pain of right scapula 06/18/2019    PCP: Genice Rouge, MD  REFERRING PROVIDER: Genice Rouge, MD  REFERRING DIAG: s/p HS, adductor and iliopsoas resection due to contractures from CP  THERAPY DIAG:  Weakness generalized  Spasticity  Muscle spasm of back  Rationale for Evaluation and Treatment: Rehabilitation  ONSET DATE: 05/22/22  SUBJECTIVE:   SUBJECTIVE STATEMENT: Patient had hip mm resections to help with spasticity.  REports that she has done well but was very painful for the first few weeks  PERTINENT HISTORY: Patient is a 27 yr old R handed female with CP- spastic diplegia and has  generalized anxiety disorder- On Sertraline for anxiety. Also has myofascial pain Here for f/u on diplegic CP and also here for TrP injections for pain control. PAIN:  Are you having pain? Yes right upper trap, neck and rhomboid area, has had theis pain prior to surgery, gets trigger point injections, she has hip painwith sitting and with lying on the left side, pain in the hips and in the lower legs  PRECAUTIONS: None  WEIGHT BEARING RESTRICTIONS: No  FALLS:  Has patient fallen in last 6 months? Yes. Number of falls 1  LIVING ENVIRONMENT: Lives with: lives with their family Lives in: House/apartment Stairs: No Has following equipment at home: Wheelchair (power)  OCCUPATION: graduated with masters degree in SW  PLOF:  prior to surgery she was independent with toilet transfers, reports cannot do this now requires max A now.  She reports independent with bed tranfers and mobility, needs max A now    PATIENT GOALS: toilet transfer on own, keep LE mms stretches  NEXT MD VISIT: next week  OBJECTIVE:    COGNITION: Overall cognitive status: Within functional limits for tasks assessed     SENSATION: WFL   MUSCLE LENGTH: Tight HS, hip flexor, ankles and adductors are tight  PALPATION: Very tight and tender in the neck, upper traps and the rhomboids, she is tender in the surgical areas  as well  LOWER EXTREMITY ROM:  Passive ROM Right eval Left eval  Hip flexion    Hip extension Cannot get to neutral "  Hip abduction 10 10  Hip adduction    Hip internal rotation    Hip external rotation    Knee flexion    Knee extension 25 30  Ankle dorsiflexion 0 0  Ankle plantarflexion    Ankle inversion    Ankle eversion     (Blank rows = not tested)  LOWER EXTREMITY MMT:  UE strength 4+/5  MMT Right eval Left eval  Hip flexion 3 3  Hip extension    Hip abduction    Hip adduction    Hip internal rotation    Hip external rotation    Knee flexion    Knee extension 2 2   Ankle dorsiflexion 2 3  Ankle plantarflexion    Ankle inversion    Ankle eversion     (Blank rows = not tested) GAIT: Distance walked: unable  TRANSFERS:  Sit to stand Max A Standing Max A with a walker Car transfer is Max A where her mom lifts her from the chair and puts her in the car. When standing her knees are not strong enough to hold her and flex, she also flexes her trunk, needs knees blocked and then help with pressure on her buttocks to get upright  TODAY'S TREATMENT:                                                                                                                              DATE:     PATIENT EDUCATION:  Education details: POC/HEP Person educated: Patient and Parent Education method: Explanation, Demonstration, Tactile cues, Verbal cues, and Handouts Education comprehension: verbalized understanding  HOME EXERCISE PROGRAM: Access Code: [email or web address] URL: [email or web address] Date: 07/11/2022 Prepared by: Stacie Glaze  Exercises - Seated Hamstring Stretch with Chair  - 1 x daily - 7 x weekly - 2 sets - 10 reps - 30 hold - Supine Hip Adductor Stretch  - 1 x daily - 7 x weekly - 2 sets - 10 reps - 30 hold  ASSESSMENT:  CLINICAL IMPRESSION: Patient is a 27 y.o. female who was seen today for physical therapy evaluation and treatment for LE mobility and transfer training after hip mm resection due to spasticity, her and her mom report a significant decrease in ability and requires max A for all transfers now and was independent and a year ago was living on own at school/college  OBJECTIVE IMPAIRMENTS: Abnormal gait, cardiopulmonary status limiting activity, decreased activity tolerance, decreased balance, decreased coordination, decreased endurance, decreased mobility, difficulty walking, decreased ROM, decreased strength, increased fascial restrictions, increased muscle spasms, impaired flexibility, postural dysfunction, and pain.   REHAB  POTENTIAL: Good  CLINICAL DECISION MAKING: Stable/uncomplicated  EVALUATION COMPLEXITY: Low   GOALS: Goals reviewed with patient? Yes  SHORT TERM GOALS: Target date: 08/01/22 Independent with initial HEP Goal status: INITIAL  LONG TERM GOALS: Target date: 10/10/22  Independent with advanced HEP Goal status: INITIAL  2.  Perform stand pivot transfer independently Goal status: INITIAL  3.  Maintain knee ROM to within 25 degrees of full extension Goal status: INITIAL  4.  Independent with bed mobility and transfers Goal status: INITIAL  5.  Do a sliding board transfer into the car Goal status: INITIAL  PLAN:  PT FREQUENCY: 1-2x/week  PT DURATION: 12 weeks  PLANNED INTERVENTIONS: Therapeutic exercises, Therapeutic activity, Neuromuscular re-education, Balance training, Gait training, Patient/Family education, Self Care, Joint mobilization, Dry Needling, Electrical stimulation, and Manual therapy  PLAN FOR NEXT SESSION: will request visits from [facility], work on ROM, functional mobility and strength for higher independence and quality of life   Jearld LeschLBRIGHT,Richar Dunklee W, PT 07/11/2022, 4:13 PM

## 2022-07-12 ENCOUNTER — Encounter
Payer: Medicaid Other | Attending: Physical Medicine and Rehabilitation | Admitting: Physical Medicine and Rehabilitation

## 2022-07-12 ENCOUNTER — Encounter: Payer: Self-pay | Admitting: Physical Medicine and Rehabilitation

## 2022-07-12 VITALS — BP 135/91 | HR 97 | Ht <= 58 in

## 2022-07-12 DIAGNOSIS — G43709 Chronic migraine without aura, not intractable, without status migrainosus: Secondary | ICD-10-CM | POA: Insufficient documentation

## 2022-07-12 DIAGNOSIS — M7918 Myalgia, other site: Secondary | ICD-10-CM

## 2022-07-12 DIAGNOSIS — G801 Spastic diplegic cerebral palsy: Secondary | ICD-10-CM | POA: Diagnosis not present

## 2022-07-12 DIAGNOSIS — R252 Cramp and spasm: Secondary | ICD-10-CM | POA: Diagnosis present

## 2022-07-12 MED ORDER — DANTROLENE SODIUM 100 MG PO CAPS: 100.0000 mg | ORAL_CAPSULE | Freq: Two times a day (BID) | ORAL | 3 refills | Status: DC

## 2022-07-12 MED ORDER — METHOCARBAMOL 500 MG PO TABS: 500.0000 mg | ORAL_TABLET | Freq: Four times a day (QID) | ORAL | 3 refills | Status: DC | PRN

## 2022-07-12 MED ORDER — LIDOCAINE HCL 1 % IJ SOLN
3.0000 mL | Freq: Once | INTRAMUSCULAR | Status: AC
  Administered 2022-07-12: 3 mL

## 2022-07-12 NOTE — Progress Notes (Signed)
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 Also has myofascial pain Here for f/u on diplegic CP and also here for TrP injections for pain control.          Getting evaluation for ADHD- but doesn't have appt til August 2024. Been waiting November 2023.     Things are good 2 months post op Had contractures release- 05/22/22- by Dr Joannie Springs  Had hamstrings, adductors and hip flexors and did Botox in Gastrocs and heel cords B/L   Said didn't get Gastrocs/Heel cords- didn't do surgery on them since would have to wear AFOs for life and cannot put on herself.  Got casts off 3 weeks ago- the pain lasted ~ 1 week and doing much better  Pain- is ~ 7/10 in neck/upper/mid back. Also has killer HA form changing barometric pressure this last week. Needs light off.    Pain from lower body- much better   Needs trigger point injections for back and neck pain. Has migraine right now as well.Marland Kitchen   Spasticity has been OK- still taking Dantrolene- 1x/day- 200 mg /day- cannot take 2nd dose of day- forgets pretty much every day. [facility] PT yesterday- but [facility] "messed up therapy orders" x2.   Was told needs KAFO- custom KAFO- they called him for referral-  Supposed to wear knee immobilizers at night-  Doesn't walk much- just with transfers- because needs hands to be on walker and cannot remove them, in w/c can accomplish more things using hands-  Has lost so much muscle mass when in casts, cannot transfer to BSC/toilet anymore Can do stand pivot transfers to something in front of her, but not to side right now.  Hasn't regained the skills. Having to have mother help in bathroom right now.  Was able to get tennis shoes on herself on both feet B/L  which is new/first time.    Exam: Awake, alert, appropriate, in power w/c; which I haven't seen before, NAD Trigger points in back and neck like  normal Knees- lacking 15-20 degrees of extension Ankles- has ~ 10 degrees beyond neutral of DF R worse than L.   Plan: Patient here for trigger point injections for  Consent done and on chart.  Cleaned areas with alcohol and injected using a 27 gauge 1.5 inch needle  Injected  Using 1% Lidocaine with no EPI  Upper traps Levators Posterior scalenes Middle scalenes Splenius Capitus Pectoralis Major Rhomboids Infraspinatus Teres Major/minor Thoracic paraspinals Lumbar paraspinals Other injections-    Patient's level of pain prior was Current level of pain after injections is  There was no bleeding or complications.  Patient was advised to drink a lot of water on day after injections to flush system Will have increased soreness for 12-48 hours after injections.  Can use Lidocaine patches the day AFTER injections Can use theracane on day of injections in places didn't inject Can use heating pad 4-6 hours AFTER injections  2. Will refill Dantrolene- 200 mg daily- - since cannot take 2x/day- #180- 3 refills  3. Refill Robaxin 500 mg 2x/day as needed- # 180- 3 months supply- with 3 refills  4. Is more sensitive to temperature lately- since epidural/anesthesia. And hot flashes- suggest PCP- has discussed with PCP- was told could be her anxiety.    5. F/U sees me q6-8 weeks. For TrP injections.    I spent a total of  29  minutes on total care today- >50% coordination of care- due to 9 minutes for Injections; 20 minutes on f/u on CP- as detailed above.

## 2022-07-12 NOTE — Patient Instructions (Signed)
Plan: Patient here for trigger point injections for  Consent done and on chart.  Cleaned areas with alcohol and injected using a 27 gauge 1.5 inch needle  Injected  Using 1% Lidocaine with no EPI  Upper traps Levators Posterior scalenes Middle scalenes Splenius Capitus Pectoralis Major Rhomboids Infraspinatus Teres Major/minor Thoracic paraspinals Lumbar paraspinals Other injections-    Patient's level of pain prior was Current level of pain after injections is  There was no bleeding or complications.  Patient was advised to drink a lot of water on day after injections to flush system Will have increased soreness for 12-48 hours after injections.  Can use Lidocaine patches the day AFTER injections Can use theracane on day of injections in places didn't inject Can use heating pad 4-6 hours AFTER injections  2. Will refill Dantrolene- 200 mg daily- - since cannot take 2x/day- #180- 3 refills  3. Refill Robaxin 500 mg 2x/day as needed- # 180- 3 months supply- with 3 refills  4. Is more sensitive to temperature lately- since epidural/anesthesia. And hot flashes- suggest PCP- has discussed with PCP- was told could be her anxiety.    5. F/U sees me q6-8 weeks. For TrP injections.

## 2022-07-16 ENCOUNTER — Encounter: Payer: Self-pay | Admitting: Physical Therapy

## 2022-07-22 ENCOUNTER — Encounter: Payer: Self-pay | Admitting: Neurology

## 2022-07-22 ENCOUNTER — Ambulatory Visit: Payer: Medicaid Other | Admitting: Neurology

## 2022-07-22 VITALS — BP 148/87 | HR 97 | Ht <= 58 in | Wt 101.4 lb

## 2022-07-22 DIAGNOSIS — G801 Spastic diplegic cerebral palsy: Secondary | ICD-10-CM | POA: Diagnosis not present

## 2022-07-22 DIAGNOSIS — G43709 Chronic migraine without aura, not intractable, without status migrainosus: Secondary | ICD-10-CM | POA: Diagnosis not present

## 2022-07-22 DIAGNOSIS — F419 Anxiety disorder, unspecified: Secondary | ICD-10-CM

## 2022-07-22 MED ORDER — METHYLPHENIDATE HCL 5 MG PO TABS: 5.0000 mg | ORAL_TABLET | Freq: Every day | ORAL | 0 refills | Status: DC

## 2022-07-22 MED ORDER — NURTEC 75 MG PO TBDP: ORAL_TABLET | ORAL | 6 refills | Status: DC

## 2022-07-22 NOTE — Progress Notes (Unsigned)
Chief Complaint  Patient presents with   Follow-up    Rm 14, alone Migraine: 3 in the past week  Cp: fatigue concerns      ASSESSMENT AND PLAN  Melissa Wagner is a 27 y.o. female Cerebral palsy, spastic diplegia, with mild upper extremity involvement left worse than right Muscle spastic pain  low-dose Cymbalta 30 mg daily was helpful, will increase to 60 mg daily  Chronic migraine headaches,  Maxalt 5 mg as needed may mix with Phenergan, NSAIDs for prolonged severe headaches,    Return to clinic with nurse practitioner in 4 months, if she desired to proceed with botulism toxin injection, may contact our office earlier for prior authorization process  DIAGNOSTIC DATA (LABS, IMAGING, TESTING) - I reviewed patient records, labs, notes, testing and imaging myself where available.   MEDICAL HISTORY:  Melissa Wagner is a 27 year old female, seen in request by his primary care at [facility] Dr. Berline Chough, [facility] for evaluation of headache, initial evaluation was on November 15, 2021  I reviewed and summarized the referring note. PMHX. Cerebral palsy Anxiety since Sept 2022  She was born with cerebral palsy, involving lower extremity more than arm, gait abnormality, gradual worsening gait abnormality, spent most of the time in wheelchair now, She just graduated from Masters degree, last year was the first year she has to live outside of her home, it was quite stressful  She has a long history of migraine, as long as she could remember, sometimes preceded by visual aura, floaters in visual field, pounding headache with light noise sensitivity, nausea, lasting for 1 day,  While she was in graduate school, she was having migraines couple times a week, since she returned home, over the past 3 weeks, she also suffered 1-2 migraines,  She has been drinking excessive amount of caffeine to help her migraine, use Excedrin Migraine as needed with some help  UPDATE Mar 21 2022: She complains  of increased migraine headache over the past few months, has missed her every 6 weeks trigger point injection, now complains of worsening muscle spasm along her spine, neck, often triggered her migraine headaches  She cannot also get a supply of her dantrolene from local pharmacy due to lack of supply,  She complains of increased diffuse body spasm, I offered multiple medication choice including baclofen, could not tolerated in the past, tizanidine, worried about the side effect  She is having migraine headache 1-2 times each week, stated insurance would not pay for Imitrex, now taking over-the-counter Excedrin Migraine once or twice every week as needed,  I also offered Botox injection for potential muscle spasm, migraine prevention, she reported she has tried Botox for lower extremity spasticity when she was 88 or 27 years old, has build up tolerance for it  She has pending orthopedic surgical procedure for lower extremity spasticity, tendon contraction  UPDATE July 22 2022: Hamstring tendon lengthen, bilateral adductor, hip flexors,  In march, she is a home, when she got out more, bariatric pressure changes,  she is now having migraine 2-3, in one weeks,   Taking excerine tylenol , wihtin one hour, it would help her.  She is no longer taking maxalt, negative affect her cymbalta,   PT, healing well, happy with her programm, has not lost any range of motiong, eveyrthigns seems to be doing well  She know how to take cymbal,  could during am, with rest of the meds, works well, as long as she stay with cymbal, migrian is  better, higher temperature during hte dy, tends to be a trigger,   She has not seen enurlogy creebral palsy, jsut some faituge, higher pain level, joint,   Not arhtritist yet, ertain days, she feel liek she does.  Sleep well, depresion anxiety,     PHYSICAL EXAM:   Vitals:   07/22/22 1259 07/22/22 1301  BP: (!) 148/91 (!) 148/87  Pulse: 96 97  Weight: 101 lb  6.6 oz (46 kg)   Height: 4\' 7"  (1.397 m)    Not recorded     Body mass index is 23.57 kg/m.  PHYSICAL EXAMNIATION:  Gen: NAD, conversant, well nourised, well groomed                     Cardiovascular: Regular rate rhythm, no peripheral edema, warm, nontender. Eyes: Conjunctivae clear without exudates or hemorrhage Neck: Supple, no carotid bruits. Pulmonary: Clear to auscultation bilaterally   NEUROLOGICAL EXAM:  MENTAL STATUS: Speech/cognition: Awake, alert, oriented to history taking and casual conversation CRANIAL NERVES: CN II: Visual fields are full to confrontation. Pupils are round equal and briskly reactive to light. CN III, IV, VI: extraocular movement are normal. No ptosis. CN V: Facial sensation is intact to light touch CN VII: Face is symmetric with normal eye closure  CN VIII: Hearing is normal to causal conversation. CN IX, X: Phonation is normal. CN XI: Head turning and shoulder shrug are intact  MOTOR: Patient sitting in wheelchair, upper extremity strengths is fairly normal, mild posturing of left hand, fixation of left upper extremity on rapid rotating movement, hypoplasia of bilateral lower extremity, mild to moderate bilateral lower extremity muscle spasticity, mild proximal distal weakness, able to move bilateral lower extremities against gravity  REFLEXES: Hyperreflexia  SENSORY: Intact to light touch,  COORDINATION: There is no trunk or limb dysmetria noted.  GAIT/STANCE: deferred  REVIEW OF SYSTEMS:  Full 14 system review of systems performed and notable only for as above All other review of systems were negative.   ALLERGIES: Allergies  Allergen Reactions   Penicillins Hives    Other Reaction(s): hives   Amoxicillin Rash    Other reaction(s): Unknown   Baclofen Nausea And Vomiting and Palpitations    Other reaction(s): Unknown  N&V    HOME MEDICATIONS: Current Outpatient Medications  Medication Sig Dispense Refill    acetaminophen (TYLENOL) 500 MG tablet Take 500 mg by mouth every 6 (six) hours as needed.     aspirin-acetaminophen-caffeine (EXCEDRIN MIGRAINE) 250-250-65 MG tablet Take by mouth every 6 (six) hours as needed for headache.     dantrolene (DANTRIUM) 100 MG capsule Take 1 capsule (100 mg total) by mouth 2 (two) times daily. For spasticity- was shorted last month- make sure not shorted- 180 capsule 3   DULoxetine (CYMBALTA) 60 MG capsule Take 1 capsule (60 mg total) by mouth daily. 90 capsule 3   escitalopram (LEXAPRO) 10 MG tablet Take 10 mg by mouth at bedtime.     methocarbamol (ROBAXIN) 500 MG tablet Take 1 tablet (500 mg total) by mouth every 6 (six) hours as needed for muscle spasms (for muscle tightness that's NOT spasticity). 180 tablet 3   norethindrone-ethinyl estradiol 1/35 (ORTHO-NOVUM) tablet Take 1 tablet by mouth daily.     promethazine (PHENERGAN) 12.5 MG tablet Take 1 tablet (12.5 mg total) by mouth every 6 (six) hours as needed for nausea or vomiting. 60 tablet 1   Current Facility-Administered Medications  Medication Dose Route Frequency Provider Last Rate Last Admin  lidocaine (XYLOCAINE) 1 % (with pres) injection 3 mL  3 mL Other Once Lovorn, Aundra Millet, MD        PAST MEDICAL HISTORY: Past Medical History:  Diagnosis Date   Anxiety    Cerebral palsy    Dysmenorrhea     PAST SURGICAL HISTORY: Past Surgical History:  Procedure Laterality Date   ADDUCTOR RELEASE     at age 57   dorsal rhizotomy     at age 14   HAMSTRING LENGTHENING     at the age 32    FAMILY HISTORY: Family History  Problem Relation Age of Onset   Hypertension Mother    Hyperlipidemia Mother    Obesity Mother    Hypothyroidism Mother    Hypertension Father    Hyperlipidemia Father     SOCIAL HISTORY: Social History   Socioeconomic History   Marital status: Single    Spouse name: Not on file   Number of children: Not on file   Years of education: Not on file   Highest education level: Not  on file  Occupational History   Not on file  Tobacco Use   Smoking status: Never   Smokeless tobacco: Never  Vaping Use   Vaping Use: Never used  Substance and Sexual Activity   Alcohol use: Never   Drug use: Never   Sexual activity: Not Currently    Birth control/protection: Pill  Other Topics Concern   Not on file  Social History Narrative   Not on file   Social Determinants of Health   Financial Resource Strain: Not on file  Food Insecurity: Not on file  Transportation Needs: Not on file  Physical Activity: Not on file  Stress: Not on file  Social Connections: Not on file  Intimate Partner Violence: Not on file      Levert Feinstein, M.D. Ph.D.  [facility] [address], [address] [place], [place] [address] Ph: [phone] Fax: [phone]  CC:  [facility] [address] [place],  [place] [address]  [facility]  Migraine headaches history of migraine in July.

## 2022-07-23 ENCOUNTER — Ambulatory Visit: Payer: Medicaid Other

## 2022-07-23 DIAGNOSIS — R252 Cramp and spasm: Secondary | ICD-10-CM

## 2022-07-23 DIAGNOSIS — R531 Weakness: Secondary | ICD-10-CM | POA: Diagnosis not present

## 2022-07-23 DIAGNOSIS — M62830 Muscle spasm of back: Secondary | ICD-10-CM

## 2022-07-23 LAB — CK: Total CK: 32 U/L (ref 32–182)

## 2022-07-23 LAB — ANA W/REFLEX IF POSITIVE
Anti JO-1: <0.2 AI (ref 0.0–0.9)
Anti Nuclear Antibody (ANA): POSITIVE — AB
Centromere Ab Screen: <0.2 AI (ref 0.0–0.9)
Chromatin Ab SerPl-aCnc: <0.2 AI (ref 0.0–0.9)
ENA RNP Ab: 3.1 AI — ABNORMAL HIGH (ref 0.0–0.9)
ENA SM Ab Ser-aCnc: <0.2 AI (ref 0.0–0.9)
ENA SSA (RO) Ab: <0.2 AI (ref 0.0–0.9)
ENA SSB (LA) Ab: 0.2 AI (ref 0.0–0.9)
Scleroderma (Scl-70) (ENA) Antibody, IgG: <0.2 AI (ref 0.0–0.9)
dsDNA Ab: <1 IU/mL (ref 0–9)

## 2022-07-23 LAB — SEDIMENTATION RATE: Sed Rate: 5 mm/hr (ref 0–32)

## 2022-07-23 LAB — C-REACTIVE PROTEIN: CRP: 2 mg/L (ref 0–10)

## 2022-07-23 NOTE — Therapy (Signed)
OUTPATIENT PHYSICAL THERAPY LOWER EXTREMITY TREATMENT   Patient Name: Melissa Wagner MRN: [id number] DOB:20-Feb-1996, 27 y.o., female Today's Date: 07/23/2022  END OF SESSION:  PT End of Session - 07/23/22 1628     Visit Number 2    Number of Visits 4    Date for PT Re-Evaluation 10/10/22    Authorization Type CC[employer]    PT Start Time 1630    PT Stop Time 1715    PT Time Calculation (min) 45 min    Activity Tolerance Patient tolerated treatment well    Behavior During Therapy WFL for tasks assessed/performed              Past Medical History:  Diagnosis Date   Anxiety    Cerebral palsy    Dysmenorrhea    Past Surgical History:  Procedure Laterality Date   ADDUCTOR RELEASE     at age 69   dorsal rhizotomy     at age 35   HAMSTRING LENGTHENING     at the age 63   Patient Active Problem List   Diagnosis Date Noted   Anxiety 07/22/2022   Contracture of both hamstrings 01/24/2022   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/24/2022   Chronic migraine w/o aura w/o status migrainosus, not intractable 11/15/2021   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   Chronic migraine without aura without status migrainosus, not intractable 08/06/2021   Spasticity 10/11/2019   Jaw pain 07/07/2019   Wheelchair dependence 06/18/2019   Diplegic cerebral palsy 06/18/2019   Myofascial pain 06/18/2019   Pain of right scapula 06/18/2019    PCP: Genice Rouge, MD  REFERRING PROVIDER: Genice Rouge, MD  REFERRING DIAG: s/p HS, adductor and iliopsoas resection due to contractures from CP  THERAPY DIAG:  Weakness generalized  Spasticity  Muscle spasm of back  Rationale for Evaluation and Treatment: Rehabilitation  ONSET DATE: 05/22/22  SUBJECTIVE:   SUBJECTIVE STATEMENT: Things are going well, working really hard the last few weeks. I am almost completely independent in my ADLs transfers.   PERTINENT HISTORY: Patient is a 27 yr old R handed female with CP- spastic diplegia and  has generalized anxiety disorder- On Sertraline for anxiety. Also has myofascial pain Here for f/u on diplegic CP and also here for TrP injections for pain control.  PAIN:  Are you having pain? Yes right upper trap, neck and rhomboid area, has had theis pain prior to surgery, gets trigger point injections, she has hip painwith sitting and with lying on the left side, pain in the hips and in the lower legs  PRECAUTIONS: None  WEIGHT BEARING RESTRICTIONS: No  FALLS:  Has patient fallen in last 6 months? Yes. Number of falls 1  LIVING ENVIRONMENT: Lives with: lives with their family Lives in: House/apartment Stairs: No Has following equipment at home: Wheelchair (power)  OCCUPATION: graduated with masters degree in SW  PLOF:  prior to surgery she was independent with toilet transfers, reports cannot do this now requires max A now.  She reports independent with bed tranfers and mobility, needs max A now    PATIENT GOALS: toilet transfer on own, keep LE mms stretches  NEXT MD VISIT: next week  OBJECTIVE:    COGNITION: Overall cognitive status: Within functional limits for tasks assessed     SENSATION: WFL   MUSCLE LENGTH: Tight HS, hip flexor, ankles and adductors are tight  PALPATION: Very tight and tender in the neck, upper traps and the rhomboids, she is tender in the  surgical areas as well  LOWER EXTREMITY ROM:  Passive ROM Right eval Left eval  Hip flexion    Hip extension Cannot get to neutral "  Hip abduction 10 10  Hip adduction    Hip internal rotation    Hip external rotation    Knee flexion    Knee extension 25 30  Ankle dorsiflexion 0 0  Ankle plantarflexion    Ankle inversion    Ankle eversion     (Blank rows = not tested)  LOWER EXTREMITY MMT:  UE strength 4+/5  MMT Right eval Left eval  Hip flexion 3 3  Hip extension    Hip abduction    Hip adduction    Hip internal rotation    Hip external rotation    Knee flexion    Knee  extension 2 2  Ankle dorsiflexion 2 3  Ankle plantarflexion    Ankle inversion    Ankle eversion     (Blank rows = not tested) GAIT: Distance walked: unable  TRANSFERS:  Sit to stand Max A Standing Max A with a walker Car transfer is Max A where her mom lifts her from the chair and puts her in the car. When standing her knees are not strong enough to hold her and flex, she also flexes her trunk, needs knees blocked and then help with pressure on her buttocks to get upright  TODAY'S TREATMENT:                                                                                                                              DATE:   07/23/22 Chair to mat transfer and back at end of visit LAQ- min activation and movement unable to do with good form so we stopped HS curls- partial ranges with yellow TB x5 each side  Seated hip abd isometric x5 with 5s hold against PT hand Passive HS stretch 30s x2 bilaterally  Hip abd stretch 30s bilaterally  SAQ x10  SLR x10 Bridges 2x10 LLLD stretch 5# 3 mins    PATIENT EDUCATION:  Education details: POC/HEP Person educated: Patient and Parent Education method: Explanation, Demonstration, Actor cues, Verbal cues, and Handouts Education comprehension: verbalized understanding  HOME EXERCISE PROGRAM: Access Code: [email or web address] URL: [email or web address] Date: 07/11/2022 Prepared by: Stacie Glaze  Exercises - Seated Hamstring Stretch with Chair  - 1 x daily - 7 x weekly - 2 sets - 10 reps - 30 hold - Supine Hip Adductor Stretch  - 1 x daily - 7 x weekly - 2 sets - 10 reps - 30 hold  ASSESSMENT:  CLINICAL IMPRESSION: Patient reports she is doing well and is able to independently do her every day transfers. modA with chair to bed transfer today. She has difficulty with all exercises, minimal muscle activation and movement. She is super tight in hamstrings. Her right leg tends to fall into ER when lying supine. Tried a low load long  duration stretch with  5# AW, did have to tie up her legs to avoid falling into rotation. Did some transfer education at end of visit to make a smoother stand pivot transfer to move the distance that is closer to the chair versus the other way around.   OBJECTIVE IMPAIRMENTS: Abnormal gait, cardiopulmonary status limiting activity, decreased activity tolerance, decreased balance, decreased coordination, decreased endurance, decreased mobility, difficulty walking, decreased ROM, decreased strength, increased fascial restrictions, increased muscle spasms, impaired flexibility, postural dysfunction, and pain.   REHAB POTENTIAL: Good  CLINICAL DECISION MAKING: Stable/uncomplicated  EVALUATION COMPLEXITY: Low   GOALS: Goals reviewed with patient? Yes  SHORT TERM GOALS: Target date: 08/01/22 Independent with initial HEP Goal status: INITIAL   LONG TERM GOALS: Target date: 10/10/22  Independent with advanced HEP Goal status: INITIAL  2.  Perform stand pivot transfer independently Goal status: INITIAL  3.  Maintain knee ROM to within 25 degrees of full extension Goal status: INITIAL  4.  Independent with bed mobility and transfers Goal status: INITIAL  5.  Do a sliding board transfer into the car Goal status: INITIAL  PLAN:  PT FREQUENCY: 1-2x/week  PT DURATION: 12 weeks  PLANNED INTERVENTIONS: Therapeutic exercises, Therapeutic activity, Neuromuscular re-education, Balance training, Gait training, Patient/Family education, Self Care, Joint mobilization, Dry Needling, Electrical stimulation, and Manual therapy  PLAN FOR NEXT SESSION: will request visits from [facility], work on ROM, functional mobility and strength for higher independence and quality of life   [place], PT 07/23/2022, 5:26 PM

## 2022-07-26 ENCOUNTER — Encounter: Payer: Self-pay | Admitting: Physical Medicine and Rehabilitation

## 2022-08-01 ENCOUNTER — Ambulatory Visit: Payer: Medicaid Other | Attending: Orthopedic Surgery

## 2022-08-01 DIAGNOSIS — R531 Weakness: Secondary | ICD-10-CM | POA: Diagnosis present

## 2022-08-01 DIAGNOSIS — R252 Cramp and spasm: Secondary | ICD-10-CM | POA: Diagnosis present

## 2022-08-01 DIAGNOSIS — M62830 Muscle spasm of back: Secondary | ICD-10-CM | POA: Diagnosis present

## 2022-08-01 NOTE — Therapy (Signed)
OUTPATIENT PHYSICAL THERAPY LOWER EXTREMITY TREATMENT   Patient Name: Melissa Wagner MRN: [id number] DOB:17-Apr-1995, 27 y.o., female Today's Date: 07/23/2022  END OF SESSION:  PT End of Session - 07/23/22 1628     Visit Number 2    Number of Visits 4    Date for PT Re-Evaluation 10/10/22    Authorization Type CC[employer]    PT Start Time 1630    PT Stop Time 1715    PT Time Calculation (min) 45 min    Activity Tolerance Patient tolerated treatment well    Behavior During Therapy WFL for tasks assessed/performed              Past Medical History:  Diagnosis Date   Anxiety    Cerebral palsy    Dysmenorrhea    Past Surgical History:  Procedure Laterality Date   ADDUCTOR RELEASE     at age 76   dorsal rhizotomy     at age 88   HAMSTRING LENGTHENING     at the age 29   Patient Active Problem List   Diagnosis Date Noted   Anxiety 07/22/2022   Contracture of both hamstrings 01/24/2022   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/24/2022   Chronic migraine w/o aura w/o status migrainosus, not intractable 11/15/2021   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   Chronic migraine without aura without status migrainosus, not intractable 08/06/2021   Spasticity 10/11/2019   Jaw pain 07/07/2019   Wheelchair dependence 06/18/2019   Diplegic cerebral palsy 06/18/2019   Myofascial pain 06/18/2019   Pain of right scapula 06/18/2019    PCP: Genice Rouge, MD  REFERRING PROVIDER: Genice Rouge, MD  REFERRING DIAG: s/p HS, adductor and iliopsoas resection due to contractures from CP  THERAPY DIAG:  Weakness generalized  Spasticity  Muscle spasm of back  Rationale for Evaluation and Treatment: Rehabilitation  ONSET DATE: 05/22/22  SUBJECTIVE:   SUBJECTIVE STATEMENT: Still sleeping in recliner, because I need help throughout the night especially going to the bathroom. I am independent with transfers unless I am tired.   PERTINENT HISTORY: Patient is a 27 yr old R handed  female with CP- spastic diplegia and has generalized anxiety disorder- On Sertraline for anxiety. Also has myofascial pain Here for f/u on diplegic CP and also here for TrP injections for pain control.  PAIN:  Are you having pain? Yes right upper trap, neck and rhomboid area, has had theis pain prior to surgery, gets trigger point injections, she has hip painwith sitting and with lying on the left side, pain in the hips and in the lower legs  PRECAUTIONS: None  WEIGHT BEARING RESTRICTIONS: No  FALLS:  Has patient fallen in last 6 months? Yes. Number of falls 1  LIVING ENVIRONMENT: Lives with: lives with their family Lives in: House/apartment Stairs: No Has following equipment at home: Wheelchair (power)  OCCUPATION: graduated with masters degree in SW  PLOF:  prior to surgery she was independent with toilet transfers, reports cannot do this now requires max A now.  She reports independent with bed tranfers and mobility, needs max A now    PATIENT GOALS: toilet transfer on own, keep LE mms stretches  NEXT MD VISIT: next week  OBJECTIVE:    COGNITION: Overall cognitive status: Within functional limits for tasks assessed     SENSATION: WFL   MUSCLE LENGTH: Tight HS, hip flexor, ankles and adductors are tight  PALPATION: Very tight and tender in the neck, upper traps and the rhomboids,  she is tender in the surgical areas as well  LOWER EXTREMITY ROM:  Passive ROM Right eval Left eval  Hip flexion    Hip extension Cannot get to neutral "  Hip abduction 10 10  Hip adduction    Hip internal rotation    Hip external rotation    Knee flexion    Knee extension 25 30  Ankle dorsiflexion 0 0  Ankle plantarflexion    Ankle inversion    Ankle eversion     (Blank rows = not tested)  LOWER EXTREMITY MMT:  UE strength 4+/5  MMT Right eval Left eval  Hip flexion 3 3  Hip extension    Hip abduction    Hip adduction    Hip internal rotation    Hip external  rotation    Knee flexion    Knee extension 2 2  Ankle dorsiflexion 2 3  Ankle plantarflexion    Ankle inversion    Ankle eversion     (Blank rows = not tested) GAIT: Distance walked: unable  TRANSFERS:  Sit to stand Max A Standing Max A with a walker Car transfer is Max A where her mom lifts her from the chair and puts her in the car. When standing her knees are not strong enough to hold her and flex, she also flexes her trunk, needs knees blocked and then help with pressure on her buttocks to get upright  TODAY'S TREATMENT:                                                                                                                              DATE:   08/01/22 Transfer to mat- stand pivot  Bed mobility rolling side to side  Bridges 2x10  Ab roll up with ball 2x10  Passive HS stretching 30s x2 each side  Banded hip abd yellow x10 Supine ball squeezes 2x10 Hip abd on slide board 2x10 Sidelying passive hip flexor stretch 30s x2 each side    07/23/22 Chair to mat transfer and back at end of visit LAQ- min activation and movement unable to do with good form so we stopped HS curls- partial ranges with yellow TB x5 each side  Seated hip abd isometric x5 with 5s hold against PT hand Passive HS stretch 30s x2 bilaterally  Hip abd stretch 30s bilaterally  SAQ x10  SLR x10 Bridges 2x10 LLLD stretch 5# 3 mins    PATIENT EDUCATION:  Education details: POC/HEP Person educated: Patient and Parent Education method: Explanation, Demonstration, Actor cues, Verbal cues, and Handouts Education comprehension: verbalized understanding  HOME EXERCISE PROGRAM: Access Code: [email or web address] URL: [email or web address] Date: 07/11/2022 Prepared by: Stacie Glaze  Exercises - Seated Hamstring Stretch with Chair  - 1 x daily - 7 x weekly - 2 sets - 10 reps - 30 hold - Supine Hip Adductor Stretch  - 1 x daily - 7 x weekly - 2 sets - 10 reps - 30 hold  ASSESSMENT:  CLINICAL  IMPRESSION: Patient reports she is doing well and is able to independently do her every day transfers. We practiced stand pivot transfers with wheelchair directed at an angle. She was hesitant because she typically makes transfers with the chair facing towards the bed. Was advised to shorten the distance between the surfaces to make the transfer easier. Is able to complete the transfer with minA. We worked on some bed level exercises working on core and hips.  OBJECTIVE IMPAIRMENTS: Abnormal gait, cardiopulmonary status limiting activity, decreased activity tolerance, decreased balance, decreased coordination, decreased endurance, decreased mobility, difficulty walking, decreased ROM, decreased strength, increased fascial restrictions, increased muscle spasms, impaired flexibility, postural dysfunction, and pain.   REHAB POTENTIAL: Good  CLINICAL DECISION MAKING: Stable/uncomplicated  EVALUATION COMPLEXITY: Low   GOALS: Goals reviewed with patient? Yes  SHORT TERM GOALS: Target date: 08/01/22 Independent with initial HEP Goal status: MET   LONG TERM GOALS: Target date: 10/10/22  Independent with advanced HEP Goal status: INITIAL  2.  Perform stand pivot transfer independently Goal status: INITIAL  3.  Maintain knee ROM to within 25 degrees of full extension Goal status: INITIAL  4.  Independent with bed mobility and transfers Goal status: INITIAL  5.  Do a sliding board transfer into the car Goal status: INITIAL  PLAN:  PT FREQUENCY: 1-2x/week  PT DURATION: 12 weeks  PLANNED INTERVENTIONS: Therapeutic exercises, Therapeutic activity, Neuromuscular re-education, Balance training, Gait training, Patient/Family education, Self Care, Joint mobilization, Dry Needling, Electrical stimulation, and Manual therapy  PLAN FOR NEXT SESSION: will request visits from [facility], work on ROM, functional mobility and strength for higher independence and quality of life   [place],  PT 07/23/2022, 5:26 PM

## 2022-08-08 ENCOUNTER — Ambulatory Visit: Payer: Medicaid Other

## 2022-08-08 DIAGNOSIS — R252 Cramp and spasm: Secondary | ICD-10-CM

## 2022-08-08 DIAGNOSIS — M62830 Muscle spasm of back: Secondary | ICD-10-CM

## 2022-08-08 DIAGNOSIS — R531 Weakness: Secondary | ICD-10-CM | POA: Diagnosis not present

## 2022-08-08 NOTE — Therapy (Signed)
OUTPATIENT PHYSICAL THERAPY LOWER EXTREMITY TREATMENT   Patient Name: Melissa Wagner MRN: [id number] DOB:1995-04-20, 27 y.o., female Today's Date: 08/08/2022  END OF SESSION:  PT End of Session - 08/08/22 1547     Visit Number 4    Number of Visits 4    Date for PT Re-Evaluation 10/10/22    Authorization Type CC[employer]    PT Start Time 1545    PT Stop Time 1630    PT Time Calculation (min) 45 min    Activity Tolerance Patient tolerated treatment well    Behavior During Therapy WFL for tasks assessed/performed               Past Medical History:  Diagnosis Date   Anxiety    Cerebral palsy (HCC)    Dysmenorrhea    Past Surgical History:  Procedure Laterality Date   ADDUCTOR RELEASE     at age 47   dorsal rhizotomy     at age 68   HAMSTRING LENGTHENING     at the age 55   Patient Active Problem List   Diagnosis Date Noted   Anxiety 07/22/2022   Contracture of both hamstrings 01/24/2022   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/24/2022   Chronic migraine w/o aura w/o status migrainosus, not intractable 11/15/2021   Spastic diplegic cerebral palsy (HCC) 11/15/2021   Chronic migraine without aura without status migrainosus, not intractable 08/06/2021   Spasticity 10/11/2019   Jaw pain 07/07/2019   Wheelchair dependence 06/18/2019   Diplegic cerebral palsy (HCC) 06/18/2019   Myofascial pain 06/18/2019   Pain of right scapula 06/18/2019    PCP: Genice Rouge, MD  REFERRING PROVIDER: Genice Rouge, MD  REFERRING DIAG: s/p HS, adductor and iliopsoas resection due to contractures from CP  THERAPY DIAG:  Weakness generalized  Spasticity  Muscle spasm of back  Rationale for Evaluation and Treatment: Rehabilitation  ONSET DATE: 05/22/22  SUBJECTIVE:   SUBJECTIVE STATEMENT: I think I hurt my hip, I woke up and everything hurt.   PERTINENT HISTORY: Patient is a 27 yr old R handed female with CP- spastic diplegia and has generalized anxiety disorder- On Sertraline  for anxiety. Also has myofascial pain Here for f/u on diplegic CP and also here for TrP injections for pain control.  PAIN:  Are you having pain? Yes right upper trap, neck and rhomboid area, has had theis pain prior to surgery, gets trigger point injections, she has hip painwith sitting and with lying on the left side, pain in the hips and in the lower legs  PRECAUTIONS: None  WEIGHT BEARING RESTRICTIONS: No  FALLS:  Has patient fallen in last 6 months? Yes. Number of falls 1  LIVING ENVIRONMENT: Lives with: lives with their family Lives in: House/apartment Stairs: No Has following equipment at home: Wheelchair (power)  OCCUPATION: graduated with masters degree in SW  PLOF:  prior to surgery she was independent with toilet transfers, reports cannot do this now requires max A now.  She reports independent with bed tranfers and mobility, needs max A now    PATIENT GOALS: toilet transfer on own, keep LE mms stretches  NEXT MD VISIT: next week  OBJECTIVE:    COGNITION: Overall cognitive status: Within functional limits for tasks assessed     SENSATION: WFL   MUSCLE LENGTH: Tight HS, hip flexor, ankles and adductors are tight  PALPATION: Very tight and tender in the neck, upper traps and the rhomboids, she is tender in the surgical areas as well  LOWER EXTREMITY ROM:  Passive ROM Right eval Left eval  Hip flexion    Hip extension Cannot get to neutral "  Hip abduction 10 10  Hip adduction    Hip internal rotation    Hip external rotation    Knee flexion    Knee extension 25 30  Ankle dorsiflexion 0 0  Ankle plantarflexion    Ankle inversion    Ankle eversion     (Blank rows = not tested)  LOWER EXTREMITY MMT:  UE strength 4+/5  MMT Right eval Left eval  Hip flexion 3 3  Hip extension    Hip abduction    Hip adduction    Hip internal rotation    Hip external rotation    Knee flexion    Knee extension 2 2  Ankle dorsiflexion 2 3  Ankle  plantarflexion    Ankle inversion    Ankle eversion     (Blank rows = not tested) GAIT: Distance walked: unable  TRANSFERS:  Sit to stand Max A Standing Max A with a walker Car transfer is Max A where her mom lifts her from the chair and puts her in the car. When standing her knees are not strong enough to hold her and flex, she also flexes her trunk, needs knees blocked and then help with pressure on her buttocks to get upright  TODAY'S TREATMENT:                                                                                                                              DATE:   08/08/22 Transfer wc<>NuStep minA  NuStep L3 x  Transfer wc<> mat table Heel slides 2x10 SLR x10, unable to get knee to go into ext  Stretching HS, adductors  Quadruped 30s  Seated marches 2#- minimal movement  08/01/22 Transfer to mat- stand pivot  Bed mobility rolling side to side  Bridges 2x10  Ab roll up with ball 2x10  Passive HS stretching 30s x2 each side  Banded hip abd yellow x10 Supine ball squeezes 2x10 Hip abd on slide board 2x10 Sidelying passive hip flexor stretch 30s x2 each side    07/23/22 Chair to mat transfer and back at end of visit LAQ- min activation and movement unable to do with good form so we stopped HS curls- partial ranges with yellow TB x5 each side  Seated hip abd isometric x5 with 5s hold against PT hand Passive HS stretch 30s x2 bilaterally  Hip abd stretch 30s bilaterally  SAQ x10  SLR x10 Bridges 2x10 LLLD stretch 5# 3 mins    PATIENT EDUCATION:  Education details: POC/HEP Person educated: Patient and Parent Education method: Explanation, Demonstration, Actor cues, Verbal cues, and Handouts Education comprehension: verbalized understanding  HOME EXERCISE PROGRAM: Access Code: [email or web address] URL: [email or web address] Date: 07/11/2022 Prepared by: Stacie Glaze  Exercises - Seated Hamstring Stretch with Chair  - 1 x daily - 7 x weekly  -  2 sets - 10 reps - 30 hold - Supine Hip Adductor Stretch  - 1 x daily - 7 x weekly - 2 sets - 10 reps - 30 hold  ASSESSMENT:  CLINICAL IMPRESSION: Patient reports she is doing fine, still doing her transfers mostly on her own but does report pain in left hip today. She also states she has a headaches today. Was able to get her on the NuStep today for the first time. Continued with some supine hip stretching and strengthening. We tried some new positions today, she was able to tolerate quadruped for 30s but. Tried tall kneeling with pball but her arms were too tired and she was unable to push up anymore.   OBJECTIVE IMPAIRMENTS: Abnormal gait, cardiopulmonary status limiting activity, decreased activity tolerance, decreased balance, decreased coordination, decreased endurance, decreased mobility, difficulty walking, decreased ROM, decreased strength, increased fascial restrictions, increased muscle spasms, impaired flexibility, postural dysfunction, and pain.   REHAB POTENTIAL: Good  CLINICAL DECISION MAKING: Stable/uncomplicated  EVALUATION COMPLEXITY: Low   GOALS: Goals reviewed with patient? Yes  SHORT TERM GOALS: Target date: 08/01/22 Independent with initial HEP Goal status: MET   LONG TERM GOALS: Target date: 10/10/22  Independent with advanced HEP Goal status: INITIAL  2.  Perform stand pivot transfer independently Goal status: INITIAL  3.  Maintain knee ROM to within 25 degrees of full extension Goal status: INITIAL  4.  Independent with bed mobility and transfers Goal status: INITIAL  5.  Do a sliding board transfer into the car Goal status: INITIAL  PLAN:  PT FREQUENCY: 1-2x/week  PT DURATION: 12 weeks  PLANNED INTERVENTIONS: Therapeutic exercises, Therapeutic activity, Neuromuscular re-education, Balance training, Gait training, Patient/Family education, Self Care, Joint mobilization, Dry Needling, Electrical stimulation, and Manual therapy  PLAN FOR NEXT  SESSION: will request visits from [facility], work on ROM, functional mobility and strength for higher independence and quality of life   [place], PT 08/08/2022, 4:27 PM

## 2022-08-13 ENCOUNTER — Ambulatory Visit: Payer: Medicaid Other | Admitting: Physical Therapy

## 2022-08-14 ENCOUNTER — Ambulatory Visit: Payer: Medicaid Other

## 2022-08-22 ENCOUNTER — Ambulatory Visit: Payer: Medicaid Other | Admitting: Physical Therapy

## 2022-08-22 ENCOUNTER — Encounter: Payer: Self-pay | Admitting: Physical Therapy

## 2022-08-22 DIAGNOSIS — R531 Weakness: Secondary | ICD-10-CM | POA: Diagnosis not present

## 2022-08-22 DIAGNOSIS — M62830 Muscle spasm of back: Secondary | ICD-10-CM

## 2022-08-22 DIAGNOSIS — R252 Cramp and spasm: Secondary | ICD-10-CM

## 2022-08-22 NOTE — Therapy (Signed)
OUTPATIENT PHYSICAL THERAPY LOWER EXTREMITY TREATMENT   Patient Name: Melissa Wagner MRN: [id number] DOB:28-Sep-1995, 27 y.o., female Today's Date: 08/22/2022  END OF SESSION:  PT End of Session - 08/22/22 1054     Visit Number 5    Date for PT Re-Evaluation 10/10/22    Authorization Type [employer] 1 of 8 08/22/22    PT Start Time 1055    PT Stop Time 1138    PT Time Calculation (min) 43 min    Activity Tolerance Patient tolerated treatment well    Behavior During Therapy WFL for tasks assessed/performed               Past Medical History:  Diagnosis Date   Anxiety    Cerebral palsy (HCC)    Dysmenorrhea    Past Surgical History:  Procedure Laterality Date   ADDUCTOR RELEASE     at age 27   dorsal rhizotomy     at age 27   HAMSTRING LENGTHENING     at the age 27   Patient Active Problem List   Diagnosis Date Noted   Anxiety 07/22/2022   Contracture of both hamstrings 01/24/2022   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/24/2022   Chronic migraine w/o aura w/o status migrainosus, not intractable 11/15/2021   Spastic diplegic cerebral palsy (HCC) 11/15/2021   Chronic migraine without aura without status migrainosus, not intractable 08/06/2021   Spasticity 10/11/2019   Jaw pain 07/07/2019   Wheelchair dependence 06/18/2019   Diplegic cerebral palsy (HCC) 06/18/2019   Myofascial pain 06/18/2019   Pain of right scapula 06/18/2019    PCP: Genice Rouge, MD  REFERRING PROVIDER: Genice Rouge, MD  REFERRING DIAG: s/p HS, adductor and iliopsoas resection due to contractures from CP  THERAPY DIAG:  Weakness generalized  Spasticity  Muscle spasm of back  Rationale for Evaluation and Treatment: Rehabilitation  ONSET DATE: 05/22/22  SUBJECTIVE:   SUBJECTIVE STATEMENT: Patient reports some questions about TENS, a back brace and pain PERTINENT HISTORY: Patient is a 27 yr old R handed female with CP- spastic diplegia and has generalized anxiety disorder- On Sertraline  for anxiety. Also has myofascial pain Here for f/u on diplegic CP and also here for TrP injections for pain control.  PAIN:  Are you having pain? Yes right upper trap, neck and rhomboid area, has had theis pain prior to surgery, gets trigger point injections, she has hip painwith sitting and with lying on the left side, pain in the hips and in the lower legs  PRECAUTIONS: None  WEIGHT BEARING RESTRICTIONS: No  FALLS:  Has patient fallen in last 6 months? Yes. Number of falls 1  LIVING ENVIRONMENT: Lives with: lives with their family Lives in: House/apartment Stairs: No Has following equipment at home: Wheelchair (power)  OCCUPATION: graduated with masters degree in SW  PLOF:  prior to surgery she was independent with toilet transfers, reports cannot do this now requires max A now.  She reports independent with bed tranfers and mobility, needs max A now    PATIENT GOALS: toilet transfer on own, keep LE mms stretches  NEXT MD VISIT: next week  OBJECTIVE:    COGNITION: Overall cognitive status: Within functional limits for tasks assessed     SENSATION: WFL   MUSCLE LENGTH: Tight HS, hip flexor, ankles and adductors are tight  PALPATION: Very tight and tender in the neck, upper traps and the rhomboids, she is tender in the surgical areas as well  LOWER EXTREMITY ROM:  Passive  ROM Right eval Left eval R/L 08/22/22  Hip flexion     Hip extension Cannot get to neutral " 5/5  Hip abduction 10 10   Hip adduction     Hip internal rotation     Hip external rotation     Knee flexion     Knee extension 25 30 20/25  Ankle dorsiflexion 0 0   Ankle plantarflexion     Ankle inversion     Ankle eversion      (Blank rows = not tested)  LOWER EXTREMITY MMT:  UE strength 4+/5  MMT Right eval Left eval  Hip flexion 3 3  Hip extension    Hip abduction    Hip adduction    Hip internal rotation    Hip external rotation    Knee flexion    Knee extension 2 2  Ankle  dorsiflexion 2 3  Ankle plantarflexion    Ankle inversion    Ankle eversion     (Blank rows = not tested) GAIT: Distance walked: unable  TRANSFERS:  Sit to stand Max A Standing Max A with a walker Car transfer is Max A where her mom lifts her from the chair and puts her in the car. When standing her knees are not strong enough to hold her and flex, she also flexes her trunk, needs knees blocked and then help with pressure on her buttocks to get upright  TODAY'S TREATMENT:                                                                                                                              DATE:   08/22/22 Transfer CGA from motorized chair to table Passive stretch of the HS, adductors, hip flexors and piriformis Talked with her about assuring to do the stretches at home and how important that is after the surgery. Transfer CGA back to chair STM to the upper traps the neck and the rhomboids, very tight and tender Estim to the upper traps and rhomboids for pain and spasm relief  08/08/22 Transfer wc<>NuStep minA  NuStep L3 x  Transfer wc<> mat table Heel slides 2x10 SLR x10, unable to get knee to go into ext  Stretching HS, adductors  Quadruped 30s  Seated marches 2#- minimal movement  08/01/22 Transfer to mat- stand pivot  Bed mobility rolling side to side  Bridges 2x10  Ab roll up with ball 2x10  Passive HS stretching 30s x2 each side  Banded hip abd yellow x10 Supine ball squeezes 2x10 Hip abd on slide board 2x10 Sidelying passive hip flexor stretch 30s x2 each side    07/23/22 Chair to mat transfer and back at end of visit LAQ- min activation and movement unable to do with good form so we stopped HS curls- partial ranges with yellow TB x5 each side  Seated hip abd isometric x5 with 5s hold against PT hand Passive HS stretch 30s x2 bilaterally  Hip abd  stretch 30s bilaterally  SAQ x10  SLR x10 Bridges 2x10 LLLD stretch 5# 3 mins    PATIENT EDUCATION:   Education details: POC/HEP Person educated: Patient and Parent Education method: Explanation, Demonstration, Actor cues, Verbal cues, and Handouts Education comprehension: verbalized understanding  HOME EXERCISE PROGRAM: Access Code: [email or web address] URL: [email or web address] Date: 07/11/2022 Prepared by: Stacie Glaze  Exercises - Seated Hamstring Stretch with Chair  - 1 x daily - 7 x weekly - 2 sets - 10 reps - 30 hold - Supine Hip Adductor Stretch  - 1 x daily - 7 x weekly - 2 sets - 10 reps - 30 hold  ASSESSMENT:  CLINICAL IMPRESSION: Patient has not been in to PT in 2 weeks, we had to work on getting more visits today is visit 1/8 for the new authorization, she had questions about pain and she has significant tightness and tenderness in the upper traps and rhomboids.  I stretched her and did measure, she does have better motion from when we tested at evaluation, still very tight and do not want to over stretch her and hurt her.  She does report transfers are easier.  Did some education about TENS, she also asked about a brace, I deferred information to her since this is not my specialty and asked if she needed a referral to another place with this specialization  OBJECTIVE IMPAIRMENTS: Abnormal gait, cardiopulmonary status limiting activity, decreased activity tolerance, decreased balance, decreased coordination, decreased endurance, decreased mobility, difficulty walking, decreased ROM, decreased strength, increased fascial restrictions, increased muscle spasms, impaired flexibility, postural dysfunction, and pain.   REHAB POTENTIAL: Good  CLINICAL DECISION MAKING: Stable/uncomplicated  EVALUATION COMPLEXITY: Low   GOALS: Goals reviewed with patient? Yes  SHORT TERM GOALS: Target date: 08/01/22 Independent with initial HEP Goal status: MET   LONG TERM GOALS: Target date: 10/10/22  Independent with advanced HEP Goal status: ongoing 08/22/22  2.  Perform stand  pivot transfer independently Goal status: INITIAL  3.  Maintain knee ROM to within 25 degrees of full extension Goal status: ongoing 08/22/22  4.  Independent with bed mobility and transfers Goal status: progressing CGA 08/22/22  5.  Do a sliding board transfer into the car Goal status: INITIAL  PLAN:  PT FREQUENCY: 1-2x/week  PT DURATION: 12 weeks  PLANNED INTERVENTIONS: Therapeutic exercises, Therapeutic activity, Neuromuscular re-education, Balance training, Gait training, Patient/Family education, Self Care, Joint mobilization, Dry Needling, Electrical stimulation, and Manual therapy  PLAN FOR NEXT SESSION: Have new auth from [facility], work on stretching and then help as we can for transfers, independence and quality of life, could educate on TENS if she chooses to get one   Jearld Lesch, PT 08/22/2022, 10:55 AM

## 2022-08-23 ENCOUNTER — Encounter
Payer: Medicaid Other | Attending: Physical Medicine and Rehabilitation | Admitting: Physical Medicine and Rehabilitation

## 2022-08-23 ENCOUNTER — Encounter: Payer: Self-pay | Admitting: Physical Medicine and Rehabilitation

## 2022-08-23 VITALS — BP 135/94 | HR 108

## 2022-08-23 DIAGNOSIS — G5702 Lesion of sciatic nerve, left lower limb: Secondary | ICD-10-CM | POA: Diagnosis not present

## 2022-08-23 DIAGNOSIS — R252 Cramp and spasm: Secondary | ICD-10-CM | POA: Insufficient documentation

## 2022-08-23 DIAGNOSIS — M7918 Myalgia, other site: Secondary | ICD-10-CM | POA: Insufficient documentation

## 2022-08-23 DIAGNOSIS — G801 Spastic diplegic cerebral palsy: Secondary | ICD-10-CM | POA: Diagnosis not present

## 2022-08-23 MED ORDER — LIDOCAINE HCL 1 % IJ SOLN
6.0000 mL | Freq: Once | INTRAMUSCULAR | Status: AC
Start: 2022-08-23 — End: 2022-08-23
  Administered 2022-08-23: 6 mL

## 2022-08-23 NOTE — Addendum Note (Signed)
Addended by: Silas Sacramento T on: 08/23/2022 02:24 PM   Modules accepted: Orders

## 2022-08-23 NOTE — Progress Notes (Signed)
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 Also has myofascial pain Here for f/u on diplegic CP and also here for TrP injections for pain control.                Lost all records- applying for masters level program in public affairs in nonprofit mgmt  Resubmitting all her application.   HA's- still there- restarted Cymbalta- not having migraines as frequently, but tension HA's still - had one constantly for last 2 weeks- coming from R shoulder-  Taking Cymbalta 60 mg daily  Neurologist prescribed Ritalin- for fatigue- not ADHD Sx's.  However was hopeful would help.   On wait list to see someone for ADHD work up.    Was worried Ritalin would make anxiety and spasticity worse. Hasn't started taking it yet.   Swollen again on R shoulder- where has trP's Got TENS unit helped for a little bit- lasted 30 minutes/relief- unitl locked up again  Got a funky blood result- was concerned for it.    [employer] waiver- medicaid waiver program- in home care for pt's with diabilities.   Now has community care and now has respite as well as help with community activities- like walking your dog- going to help with laundry skills; cleaning, cooking, etc. Understand how they occur, but not been able to do so far.   Put on wait list in 2004 and finally got approved  Taking Robaxin at night- it's working well, when uses- 2x at night- has been helpful- for muscle tightness.  Pain going into R arm and elbow area.   L hip also bothering her- actually is L low back- buttock-   Exam: BP 135/94 HR 108-  In manual w/c-  L piriformis syndrome-  base don TTP over pirifomris muscle   Plan: CMP due at next visit- not today- the LFT's labs.   2. Suggest/agree with getting TENS unit for tension HA's.   3.  Is allergic to Baclofen- cannot take.    4. Got Rx for Ritalin from Neuro- agree that this dose shouldn't  be an issue for spasticity/anxiety.  Suggest actually taking   5.  Tennis ball- 10-15 minutes/day-  x 1 month, then as needed- went over piriformis syndrome- and that can pinch of sciatic nerve- can cause nerve Symptoms and muscle tightness- also demonstrated muscle stretch- to pull L leg to chest-  knee on L side winged out- to do stretch successfully.   6. Patient here for trigger point injections for spasticity, myofascial pain  Consent done and on chart.  Cleaned areas with alcohol and injected using a 27 gauge 1.5 inch needle  Injected 5cc- wasted 1cc Using 1% Lidocaine with no EPI  Upper traps B/L  Levators- B/L  Posterior scalenes Middle scalenes- B/L  Splenius Capitus- R only Pectoralis Major Rhomboids- x6 on R and 1 on L Infraspinatus Teres Major/minor Thoracic paraspinals Lumbar paraspinals Other injections-    Patient's level of pain prior was 6/10 when came in Current level of pain after injections is 4/10 already- mainly the L buttock  There was no bleeding or complications.  Patient was advised to drink a lot of water on day after injections to flush system Will have increased soreness for 12-48 hours after injections.  Can use Lidocaine patches the day AFTER injections Can use theracane on day of injections in places didn't inject Can use heating pad 4-6 hours AFTER  injections   7/ F/U in 6 weeks- will do CMP then- TrP injections and f/u on CP.    I spent a total of 43   minutes on total care today- >50% coordination of care- due to  10 minutes on injections- and 34 minutes discussion/educating on piriformis syndrome and R shoulder pain

## 2022-08-23 NOTE — Patient Instructions (Signed)
Plan: CMP due at next visit- not today- the LFT's labs.   2. Suggest/agree with getting TENS unit for tension HA's.   3.  Is allergic to Baclofen- cannot take.    4. Got Rx for Ritalin from Neuro- agree that this dose shouldn't be an issue for spasticity/anxiety.  Suggest actually taking   5.  Tennis ball- 10-15 minutes/day-  x 1 month, then as needed- went over piriformis syndrome- and that can pinch of sciatic nerve- can cause nerve Symptoms and muscle tightness- also demonstrated muscle stretch- to pull L leg to chest-  knee on L side winged out- to do stretch successfully.   6. Patient here for trigger point injections for spasticity, myofascial pain  Consent done and on chart.  Cleaned areas with alcohol and injected using a 27 gauge 1.5 inch needle  Injected 5cc- wasted 1cc Using 1% Lidocaine with no EPI  Upper traps B/L  Levators- B/L  Posterior scalenes Middle scalenes- B/L  Splenius Capitus- R only Pectoralis Major Rhomboids- x6 on R and 1 on L Infraspinatus Teres Major/minor Thoracic paraspinals Lumbar paraspinals Other injections-    Patient's level of pain prior was 6/10 when came in Current level of pain after injections is 4/10 already- mainly the L buttock  There was no bleeding or complications.  Patient was advised to drink a lot of water on day after injections to flush system Will have increased soreness for 12-48 hours after injections.  Can use Lidocaine patches the day AFTER injections Can use theracane on day of injections in places didn't inject Can use heating pad 4-6 hours AFTER injections   7/ F/U in 6 weeks- will do CMP then- TrP injections and f/u on CP.

## 2022-08-28 ENCOUNTER — Encounter: Payer: Self-pay | Admitting: Physical Therapy

## 2022-08-28 ENCOUNTER — Ambulatory Visit: Payer: Medicaid Other | Admitting: Physical Therapy

## 2022-08-28 DIAGNOSIS — R531 Weakness: Secondary | ICD-10-CM

## 2022-08-28 DIAGNOSIS — R252 Cramp and spasm: Secondary | ICD-10-CM

## 2022-08-28 DIAGNOSIS — M62830 Muscle spasm of back: Secondary | ICD-10-CM

## 2022-08-28 NOTE — Therapy (Signed)
OUTPATIENT PHYSICAL THERAPY LOWER EXTREMITY TREATMENT   Patient Name: Melissa Wagner MRN: [id number] DOB:1995-07-05, 27 y.o., female Today's Date: 08/28/2022  END OF SESSION:  PT End of Session - 08/28/22 1609     Visit Number 6    Date for PT Re-Evaluation 10/10/22    Authorization Type CCME 2 of 8 08/22/22    PT Start Time 1609    PT Stop Time 1655    PT Time Calculation (min) 46 min    Activity Tolerance Patient tolerated treatment well    Behavior During Therapy WFL for tasks assessed/performed               Past Medical History:  Diagnosis Date   Anxiety    Cerebral palsy (HCC)    Dysmenorrhea    Past Surgical History:  Procedure Laterality Date   ADDUCTOR RELEASE     at age 43   dorsal rhizotomy     at age 62   HAMSTRING LENGTHENING     at the age 58   Patient Active Problem List   Diagnosis Date Noted   Piriformis syndrome of left side 08/23/2022   Anxiety 07/22/2022   Contracture of both hamstrings 01/24/2022   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/24/2022   Chronic migraine w/o aura w/o status migrainosus, not intractable 11/15/2021   Spastic diplegic cerebral palsy (HCC) 11/15/2021   Chronic migraine without aura without status migrainosus, not intractable 08/06/2021   Spasticity 10/11/2019   Jaw pain 07/07/2019   Wheelchair dependence 06/18/2019   Diplegic cerebral palsy (HCC) 06/18/2019   Myofascial pain 06/18/2019   Pain of right scapula 06/18/2019    PCP: Genice Rouge, MD  REFERRING PROVIDER: Genice Rouge, MD  REFERRING DIAG: s/p HS, adductor and iliopsoas resection due to contractures from CP  THERAPY DIAG:  Weakness generalized  Spasticity  Muscle spasm of back  Rationale for Evaluation and Treatment: Rehabilitation  ONSET DATE: 05/22/22  SUBJECTIVE:   SUBJECTIVE STATEMENT: Reports that the last treatment really helped the pain in the back, doing better with this, reports not stretching at home PERTINENT HISTORY: Patient is a  27 yr old R handed female with CP- spastic diplegia and has generalized anxiety disorder- On Sertraline for anxiety. Also has myofascial pain Here for f/u on diplegic CP and also here for TrP injections for pain control.  PAIN:  Are you having pain? Yes right upper trap, neck and rhomboid area, has had theis pain prior to surgery, gets trigger point injections, she has hip painwith sitting and with lying on the left side, pain in the hips and in the lower legs  PRECAUTIONS: None  WEIGHT BEARING RESTRICTIONS: No  FALLS:  Has patient fallen in last 6 months? Yes. Number of falls 1  LIVING ENVIRONMENT: Lives with: lives with their family Lives in: House/apartment Stairs: No Has following equipment at home: Wheelchair (power)  OCCUPATION: graduated with masters degree in SW  PLOF:  prior to surgery she was independent with toilet transfers, reports cannot do this now requires max A now.  She reports independent with bed tranfers and mobility, needs max A now    PATIENT GOALS: toilet transfer on own, keep LE mms stretches  NEXT MD VISIT: next week  OBJECTIVE:    COGNITION: Overall cognitive status: Within functional limits for tasks assessed     SENSATION: WFL   MUSCLE LENGTH: Tight HS, hip flexor, ankles and adductors are tight  PALPATION: Very tight and tender in the neck, upper traps  and the rhomboids, she is tender in the surgical areas as well  LOWER EXTREMITY ROM:  Passive ROM Right eval Left eval R/L 08/22/22  Hip flexion     Hip extension Cannot get to neutral " 5/5  Hip abduction 10 10   Hip adduction     Hip internal rotation     Hip external rotation     Knee flexion     Knee extension 25 30 20/25  Ankle dorsiflexion 0 0   Ankle plantarflexion     Ankle inversion     Ankle eversion      (Blank rows = not tested)  LOWER EXTREMITY MMT:  UE strength 4+/5  MMT Right eval Left eval  Hip flexion 3 3  Hip extension    Hip abduction    Hip  adduction    Hip internal rotation    Hip external rotation    Knee flexion    Knee extension 2 2  Ankle dorsiflexion 2 3  Ankle plantarflexion    Ankle inversion    Ankle eversion     (Blank rows = not tested) GAIT: Distance walked: unable  TRANSFERS:  Sit to stand Max A Standing Max A with a walker Car transfer is Max A where her mom lifts her from the chair and puts her in the car. When standing her knees are not strong enough to hold her and flex, she also flexes her trunk, needs knees blocked and then help with pressure on her buttocks to get upright  TODAY'S TREATMENT:                                                                                                                              DATE:   08/28/22 Transfer to mat table in OT room Min A from the smaller manual chair Passive stretch to the LE's in supine Bridges Partial sit ups with assist Standing with ball b/n knees, PT blocking knees from forward PT hands on the back side and then verbal cues for hips forward and to get tall and chest up and out, stood about 1 minute x4, also did partial sit to stands and hips forward and back and knees Tried SAQ with assist  08/22/22 Transfer CGA from motorized chair to table Passive stretch of the HS, adductors, hip flexors and piriformis Talked with her about assuring to do the stretches at home and how important that is after the surgery. Transfer CGA back to chair STM to the upper traps the neck and the rhomboids, very tight and tender Estim to the upper traps and rhomboids for pain and spasm relief  08/08/22 Transfer wc<>NuStep minA  NuStep L3 x  Transfer wc<> mat table Heel slides 2x10 SLR x10, unable to get knee to go into ext  Stretching HS, adductors  Quadruped 30s  Seated marches 2#- minimal movement  08/01/22 Transfer to mat- stand pivot  Bed mobility rolling side to side  Bridges 2x10  Ab roll up with ball 2x10  Passive HS stretching 30s x2 each side   Banded hip abd yellow x10 Supine ball squeezes 2x10 Hip abd on slide board 2x10 Sidelying passive hip flexor stretch 30s x2 each side    07/23/22 Chair to mat transfer and back at end of visit LAQ- min activation and movement unable to do with good form so we stopped HS curls- partial ranges with yellow TB x5 each side  Seated hip abd isometric x5 with 5s hold against PT hand Passive HS stretch 30s x2 bilaterally  Hip abd stretch 30s bilaterally  SAQ x10  SLR x10 Bridges 2x10 LLLD stretch 5# 3 mins    PATIENT EDUCATION:  Education details: POC/HEP Person educated: Patient and Parent Education method: Explanation, Demonstration, Actor cues, Verbal cues, and Handouts Education comprehension: verbalized understanding  HOME EXERCISE PROGRAM: Access Code: [email or web address] URL: [email or web address] Date: 07/11/2022 Prepared by: Stacie Glaze  Exercises - Seated Hamstring Stretch with Chair  - 1 x daily - 7 x weekly - 2 sets - 10 reps - 30 hold - Supine Hip Adductor Stretch  - 1 x daily - 7 x weekly - 2 sets - 10 reps - 30 hold  ASSESSMENT:  CLINICAL IMPRESSION: Patient doing better with less back pain today, she does mention not doing stretching at home, I did a lot of focus on stretching, I also worked a lot on standing and getting mm activity to bring hips forward, her knees collapse into valgus so we used a ball b/n the knees.  She did c/o some foot and ankle pain and some low back tightness, she does report that the shoes she has not worn before  OBJECTIVE IMPAIRMENTS: Abnormal gait, cardiopulmonary status limiting activity, decreased activity tolerance, decreased balance, decreased coordination, decreased endurance, decreased mobility, difficulty walking, decreased ROM, decreased strength, increased fascial restrictions, increased muscle spasms, impaired flexibility, postural dysfunction, and pain.   REHAB POTENTIAL: Good  CLINICAL DECISION MAKING:  Stable/uncomplicated  EVALUATION COMPLEXITY: Low   GOALS: Goals reviewed with patient? Yes  SHORT TERM GOALS: Target date: 08/01/22 Independent with initial HEP Goal status: MET   LONG TERM GOALS: Target date: 10/10/22  Independent with advanced HEP Goal status: ongoing 08/22/22  2.  Perform stand pivot transfer independently Goal status:ongoing 08/28/22  3.  Maintain knee ROM to within 25 degrees of full extension Goal status: ongoing 08/22/22  4.  Independent with bed mobility and transfers Goal status: progressing CGA 08/22/22  5.  Do a sliding board transfer into the car Goal status: ongoing 08/28/22  PLAN:  PT FREQUENCY: 1-2x/week  PT DURATION: 12 weeks  PLANNED INTERVENTIONS: Therapeutic exercises, Therapeutic activity, Neuromuscular re-education, Balance training, Gait training, Patient/Family education, Self Care, Joint mobilization, Dry Needling, Electrical stimulation, and Manual therapy  PLAN FOR NEXT SESSION: Have new auth from [facility], work on stretching and then help as we can for transfers, independence and quality of life, could educate on TENS if she chooses to get one   Jearld Lesch, PT 08/28/2022, 5:23 PM

## 2022-09-05 ENCOUNTER — Encounter: Payer: Self-pay | Admitting: Physical Therapy

## 2022-09-05 ENCOUNTER — Ambulatory Visit: Payer: Medicaid Other | Attending: Orthopedic Surgery | Admitting: Physical Therapy

## 2022-09-05 DIAGNOSIS — R531 Weakness: Secondary | ICD-10-CM | POA: Insufficient documentation

## 2022-09-05 DIAGNOSIS — R252 Cramp and spasm: Secondary | ICD-10-CM | POA: Insufficient documentation

## 2022-09-05 DIAGNOSIS — M62830 Muscle spasm of back: Secondary | ICD-10-CM | POA: Insufficient documentation

## 2022-09-05 NOTE — Therapy (Signed)
OUTPATIENT PHYSICAL THERAPY LOWER EXTREMITY TREATMENT   Patient Name: Melissa Wagner MRN: [id number] DOB:[birth year], 27 y.o., female Today's Date: 09/05/2022  END OF SESSION:  PT End of Session - 09/05/22 1531     Visit Number 7    Date for PT Re-Evaluation 10/10/22    Authorization Type [employer] 3 of 8 08/22/22    PT Start Time 1528    PT Stop Time 1612    PT Time Calculation (min) 44 min    Activity Tolerance Patient tolerated treatment well    Behavior During Therapy WFL for tasks assessed/performed               Past Medical History:  Diagnosis Date   Anxiety    Cerebral palsy (HCC)    Dysmenorrhea    Past Surgical History:  Procedure Laterality Date   ADDUCTOR RELEASE     at age 53   dorsal rhizotomy     at age 69   HAMSTRING LENGTHENING     at the age 37   Patient Active Problem List   Diagnosis Date Noted   Piriformis syndrome of left side 08/23/2022   Anxiety 07/22/2022   Contracture of both hamstrings 01/24/2022   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/24/2022   Chronic migraine w/o aura w/o status migrainosus, not intractable 11/15/2021   Spastic diplegic cerebral palsy (HCC) 11/15/2021   Chronic migraine without aura without status migrainosus, not intractable 08/06/2021   Spasticity 10/11/2019   Jaw pain 07/07/2019   Wheelchair dependence 06/18/2019   Diplegic cerebral palsy (HCC) 06/18/2019   Myofascial pain 06/18/2019   Pain of right scapula 06/18/2019    PCP: Genice Rouge, MD  REFERRING PROVIDER: Genice Rouge, MD  REFERRING DIAG: s/p HS, adductor and iliopsoas resection due to contractures from CP  THERAPY DIAG:  Weakness generalized  Spasticity  Muscle spasm of back  Rationale for Evaluation and Treatment: Rehabilitation  ONSET DATE: 05/22/22  SUBJECTIVE:   SUBJECTIVE STATEMENT: Reports that she did not take the anti spasmodics and has lost an arm rest on her chair. PERTINENT HISTORY: Patient is a 27 yr old R handed female with CP-  spastic diplegia and has generalized anxiety disorder- On Sertraline for anxiety. Also has myofascial pain Here for f/u on diplegic CP and also here for TrP injections for pain control.  PAIN:  Are you having pain? Yes right upper trap, neck and rhomboid area, has had theis pain prior to surgery, gets trigger point injections, she has hip painwith sitting and with lying on the left side, pain in the hips and in the lower legs  PRECAUTIONS: None  WEIGHT BEARING RESTRICTIONS: No  FALLS:  Has patient fallen in last 6 months? Yes. Number of falls 1  LIVING ENVIRONMENT: Lives with: lives with their family Lives in: House/apartment Stairs: No Has following equipment at home: Wheelchair (power)  OCCUPATION: graduated with masters degree in SW  PLOF:  prior to surgery she was independent with toilet transfers, reports cannot do this now requires max A now.  She reports independent with bed tranfers and mobility, needs max A now    PATIENT GOALS: toilet transfer on own, keep LE mms stretches  NEXT MD VISIT: next week  OBJECTIVE:    COGNITION: Overall cognitive status: Within functional limits for tasks assessed     SENSATION: WFL   MUSCLE LENGTH: Tight HS, hip flexor, ankles and adductors are tight  PALPATION: Very tight and tender in the neck, upper traps and the rhomboids,  she is tender in the surgical areas as well  LOWER EXTREMITY ROM:  Passive ROM Right eval Left eval R/L 08/22/22  Hip flexion     Hip extension Cannot get to neutral " 5/5  Hip abduction 10 10   Hip adduction     Hip internal rotation     Hip external rotation     Knee flexion     Knee extension 25 30 20/25  Ankle dorsiflexion 0 0   Ankle plantarflexion     Ankle inversion     Ankle eversion      (Blank rows = not tested)  LOWER EXTREMITY MMT:  UE strength 4+/5  MMT Right eval Left eval  Hip flexion 3 3  Hip extension    Hip abduction    Hip adduction    Hip internal rotation     Hip external rotation    Knee flexion    Knee extension 2 2  Ankle dorsiflexion 2 3  Ankle plantarflexion    Ankle inversion    Ankle eversion     (Blank rows = not tested) GAIT: Distance walked: unable  TRANSFERS:  Sit to stand Max A Standing Max A with a walker Car transfer is Max A where her mom lifts her from the chair and puts her in the car. When standing her knees are not strong enough to hold her and flex, she also flexes her trunk, needs knees blocked and then help with pressure on her buttocks to get upright  TODAY'S TREATMENT:                                                                                                                              DATE:   09/04/22 Min A transfers Nustep level 5 x 5 mintues Standing with 6" step, 2 persone one in front with hand on buttocks to bring hips forward and the other behind with hands on knees to get extension 3 x 1 minute ball b/n knees Supine passive stretch of the HS, hip flexor, adductors, calves  Henreitta Leber SAQ with assist some eccentrics Did bathroom standing using the bar Talked about chair safety as her brakes are not working  08/28/22 Transfer to mat table in OT room Min A from the smaller manual chair Passive stretch to the LE's in supine Bridges Partial sit ups with assist Standing with ball b/n knees, PT blocking knees from forward PT hands on the back side and then verbal cues for hips forward and to get tall and chest up and out, stood about 1 minute x4, also did partial sit to stands and hips forward and back and knees Tried SAQ with assist  08/22/22 Transfer CGA from motorized chair to table Passive stretch of the HS, adductors, hip flexors and piriformis Talked with her about assuring to do the stretches at home and how important that is after the surgery. Transfer CGA back to chair STM to the upper traps the  neck and the rhomboids, very tight and tender Estim to the upper traps and rhomboids for pain and  spasm relief  08/08/22 Transfer wc<>NuStep minA  NuStep L3 x  Transfer wc<> mat table Heel slides 2x10 SLR x10, unable to get knee to go into ext  Stretching HS, adductors  Quadruped 30s  Seated marches 2#- minimal movement  08/01/22 Transfer to mat- stand pivot  Bed mobility rolling side to side  Bridges 2x10  Ab roll up with ball 2x10  Passive HS stretching 30s x2 each side  Banded hip abd yellow x10 Supine ball squeezes 2x10 Hip abd on slide board 2x10 Sidelying passive hip flexor stretch 30s x2 each side    07/23/22 Chair to mat transfer and back at end of visit LAQ- min activation and movement unable to do with good form so we stopped HS curls- partial ranges with yellow TB x5 each side  Seated hip abd isometric x5 with 5s hold against PT hand Passive HS stretch 30s x2 bilaterally  Hip abd stretch 30s bilaterally  SAQ x10  SLR x10 Bridges 2x10 LLLD stretch 5# 3 mins    PATIENT EDUCATION:  Education details: POC/HEP Person educated: Patient and Parent Education method: Explanation, Demonstration, Actor cues, Verbal cues, and Handouts Education comprehension: verbalized understanding  HOME EXERCISE PROGRAM: Access Code: [email or web address] URL: [email or web address] Date: 07/11/2022 Prepared by: Stacie Glaze  Exercises - Seated Hamstring Stretch with Chair  - 1 x daily - 7 x weekly - 2 sets - 10 reps - 30 hold - Supine Hip Adductor Stretch  - 1 x daily - 7 x weekly - 2 sets - 10 reps - 30 hold  ASSESSMENT:  CLINICAL IMPRESSION: Patient doing better with less back pain today, reports doing some stretches at home, working on transfers and her flexibility, needed some extra help due to knee flexion and hip flexion as we were standing trying to get better posture and better weight bearing  OBJECTIVE IMPAIRMENTS: Abnormal gait, cardiopulmonary status limiting activity, decreased activity tolerance, decreased balance, decreased coordination, decreased  endurance, decreased mobility, difficulty walking, decreased ROM, decreased strength, increased fascial restrictions, increased muscle spasms, impaired flexibility, postural dysfunction, and pain.   REHAB POTENTIAL: Good  CLINICAL DECISION MAKING: Stable/uncomplicated  EVALUATION COMPLEXITY: Low   GOALS: Goals reviewed with patient? Yes  SHORT TERM GOALS: Target date: 08/01/22 Independent with initial HEP Goal status: MET   LONG TERM GOALS: Target date: 10/10/22  Independent with advanced HEP Goal status: ongoing 08/22/22  2.  Perform stand pivot transfer independently Goal status:ongoing 08/28/22  3.  Maintain knee ROM to within 25 degrees of full extension Goal status: ongoing 08/22/22  4.  Independent with bed mobility and transfers Goal status: progressing CGA 08/22/22  5.  Do a sliding board transfer into the car Goal status: ongoing 08/28/22  PLAN:  PT FREQUENCY: 1-2x/week  PT DURATION: 12 weeks  PLANNED INTERVENTIONS: Therapeutic exercises, Therapeutic activity, Neuromuscular re-education, Balance training, Gait training, Patient/Family education, Self Care, Joint mobilization, Dry Needling, Electrical stimulation, and Manual therapy  PLAN FOR NEXT SESSION: Have new auth from [facility], work on stretching and then help as we can for transfers, independence and quality of life, could educate on TENS if she chooses to get one   Jearld Lesch, PT 09/05/2022, 3:32 PM

## 2022-09-11 ENCOUNTER — Ambulatory Visit: Payer: Medicaid Other | Admitting: Physical Therapy

## 2022-09-11 ENCOUNTER — Encounter: Payer: Self-pay | Admitting: Physical Therapy

## 2022-09-11 DIAGNOSIS — R252 Cramp and spasm: Secondary | ICD-10-CM

## 2022-09-11 DIAGNOSIS — R531 Weakness: Secondary | ICD-10-CM | POA: Diagnosis not present

## 2022-09-11 DIAGNOSIS — M62830 Muscle spasm of back: Secondary | ICD-10-CM

## 2022-09-11 NOTE — Therapy (Signed)
OUTPATIENT PHYSICAL THERAPY LOWER EXTREMITY TREATMENT   Patient Name: Melissa Wagner MRN: [id number] DOB:03/17/1996, 27 y.o., female Today's Date: 09/11/2022  END OF SESSION:  PT End of Session - 09/11/22 1352     Visit Number 8    Authorization Type CCME 4 of 8 10/16/22    PT Start Time 1353    PT Stop Time 1440    PT Time Calculation (min) 47 min    Activity Tolerance Patient tolerated treatment well    Behavior During Therapy WFL for tasks assessed/performed               Past Medical History:  Diagnosis Date   Anxiety    Cerebral palsy (HCC)    Dysmenorrhea    Past Surgical History:  Procedure Laterality Date   ADDUCTOR RELEASE     at age 39   dorsal rhizotomy     at age 22   HAMSTRING LENGTHENING     at the age 33   Patient Active Problem List   Diagnosis Date Noted   Piriformis syndrome of left side 08/23/2022   Anxiety 07/22/2022   Contracture of both hamstrings 01/24/2022   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/24/2022   Chronic migraine w/o aura w/o status migrainosus, not intractable 11/15/2021   Spastic diplegic cerebral palsy (HCC) 11/15/2021   Chronic migraine without aura without status migrainosus, not intractable 08/06/2021   Spasticity 10/11/2019   Jaw pain 07/07/2019   Wheelchair dependence 06/18/2019   Diplegic cerebral palsy (HCC) 06/18/2019   Myofascial pain 06/18/2019   Pain of right scapula 06/18/2019    PCP: Genice Rouge, MD  REFERRING PROVIDER: Genice Rouge, MD  REFERRING DIAG: s/p HS, adductor and iliopsoas resection due to contractures from CP  THERAPY DIAG:  Weakness generalized  Spasticity  Muscle spasm of back  Rationale for Evaluation and Treatment: Rehabilitation  ONSET DATE: 05/22/22  SUBJECTIVE:   SUBJECTIVE STATEMENT: She has not stretched at home, she reports that she has been having the shoulder and upper back pain.  She did take the anti spasmotic medications PERTINENT HISTORY: Patient is a 27 yr old R  handed female with CP- spastic diplegia and has generalized anxiety disorder- On Sertraline for anxiety. Also has myofascial pain Here for f/u on diplegic CP and also here for TrP injections for pain control.  PAIN:  Are you having pain? Yes right upper trap, neck and rhomboid area, has had theis pain prior to surgery, gets trigger point injections, she has hip painwith sitting and with lying on the left side, pain in the hips and in the lower legs  PRECAUTIONS: None  WEIGHT BEARING RESTRICTIONS: No  FALLS:  Has patient fallen in last 6 months? Yes. Number of falls 1  LIVING ENVIRONMENT: Lives with: lives with their family Lives in: House/apartment Stairs: No Has following equipment at home: Wheelchair (power)  OCCUPATION: graduated with masters degree in SW  PLOF:  prior to surgery she was independent with toilet transfers, reports cannot do this now requires max A now.  She reports independent with bed tranfers and mobility, needs max A now    PATIENT GOALS: toilet transfer on own, keep LE mms stretches  NEXT MD VISIT: next week  OBJECTIVE:    COGNITION: Overall cognitive status: Within functional limits for tasks assessed     SENSATION: WFL   MUSCLE LENGTH: Tight HS, hip flexor, ankles and adductors are tight  PALPATION: Very tight and tender in the neck, upper traps and the  rhomboids, she is tender in the surgical areas as well  LOWER EXTREMITY ROM:  Passive ROM Right eval Left eval R/L 08/22/22  Hip flexion     Hip extension Cannot get to neutral " 5/5  Hip abduction 10 10   Hip adduction     Hip internal rotation     Hip external rotation     Knee flexion     Knee extension 25 30 20/25  Ankle dorsiflexion 0 0   Ankle plantarflexion     Ankle inversion     Ankle eversion      (Blank rows = not tested)  LOWER EXTREMITY MMT:  UE strength 4+/5  MMT Right eval Left eval  Hip flexion 3 3  Hip extension    Hip abduction    Hip adduction    Hip  internal rotation    Hip external rotation    Knee flexion    Knee extension 2 2  Ankle dorsiflexion 2 3  Ankle plantarflexion    Ankle inversion    Ankle eversion     (Blank rows = not tested) GAIT: Distance walked: unable  TRANSFERS:  Sit to stand Max A Standing Max A with a walker Car transfer is Max A where her mom lifts her from the chair and puts her in the car. When standing her knees are not strong enough to hold her and flex, she also flexes her trunk, needs knees blocked and then help with pressure on her buttocks to get upright  TODAY'S TREATMENT:                                                                                                                              DATE:   09/11/22 Nustep level 3 x 6 minutes Passive stretch of the HS, adductors and hip flexors STM to the right upper trap, necka nd rhomboids Standing with ball b/n knees, knees blocked and PT hands on hips working on weight bearing and knee extension and back extensio Transfers with Min A mostly set up, I did try to fix the brakes on the W/C but it did not work as it seems the tread on the tire is worn out  09/04/22 Min A transfers Nustep level 5 x 5 mintues Standing with 6" step, 2 persone one in front with hand on buttocks to bring hips forward and the other behind with hands on knees to get extension 3 x 1 minute ball b/n knees Supine passive stretch of the HS, hip flexor, adductors, calves  Henreitta Leber SAQ with assist some eccentrics Did bathroom standing using the bar Talked about chair safety as her brakes are not working  08/28/22 Transfer to mat table in OT room Min A from the smaller manual chair Passive stretch to the LE's in supine Bridges Partial sit ups with assist Standing with ball b/n knees, PT blocking knees from forward PT hands on the back side and then verbal cues for  hips forward and to get tall and chest up and out, stood about 1 minute x4, also did partial sit to stands and hips  forward and back and knees Tried SAQ with assist  08/22/22 Transfer CGA from motorized chair to table Passive stretch of the HS, adductors, hip flexors and piriformis Talked with her about assuring to do the stretches at home and how important that is after the surgery. Transfer CGA back to chair STM to the upper traps the neck and the rhomboids, very tight and tender Estim to the upper traps and rhomboids for pain and spasm relief  08/08/22 Transfer wc<>NuStep minA  NuStep L3 x  Transfer wc<> mat table Heel slides 2x10 SLR x10, unable to get knee to go into ext  Stretching HS, adductors  Quadruped 30s  Seated marches 2#- minimal movement  08/01/22 Transfer to mat- stand pivot  Bed mobility rolling side to side  Bridges 2x10  Ab roll up with ball 2x10  Passive HS stretching 30s x2 each side  Banded hip abd yellow x10 Supine ball squeezes 2x10 Hip abd on slide board 2x10 Sidelying passive hip flexor stretch 30s x2 each side    07/23/22 Chair to mat transfer and back at end of visit LAQ- min activation and movement unable to do with good form so we stopped HS curls- partial ranges with yellow TB x5 each side  Seated hip abd isometric x5 with 5s hold against PT hand Passive HS stretch 30s x2 bilaterally  Hip abd stretch 30s bilaterally  SAQ x10  SLR x10 Bridges 2x10 LLLD stretch 5# 3 mins    PATIENT EDUCATION:  Education details: POC/HEP Person educated: Patient and Parent Education method: Explanation, Demonstration, Actor cues, Verbal cues, and Handouts Education comprehension: verbalized understanding  HOME EXERCISE PROGRAM: Access Code: [email or web address] URL: [email or web address] Date: 07/11/2022 Prepared by: Stacie Glaze  Exercises - Seated Hamstring Stretch with Chair  - 1 x daily - 7 x weekly - 2 sets - 10 reps - 30 hold - Supine Hip Adductor Stretch  - 1 x daily - 7 x weekly - 2 sets - 10 reps - 30 hold  ASSESSMENT:  CLINICAL  IMPRESSION: Patient reports not doing the stretches at home, I strongly encouraged the stretches and really to get in the standing frame.  To help with the stretching of the hips and the knees, she had some back pain doing the leg fallouts trying to get adductors stretch, switched to straight leg adductor stretch OBJECTIVE IMPAIRMENTS: Abnormal gait, cardiopulmonary status limiting activity, decreased activity tolerance, decreased balance, decreased coordination, decreased endurance, decreased mobility, difficulty walking, decreased ROM, decreased strength, increased fascial restrictions, increased muscle spasms, impaired flexibility, postural dysfunction, and pain.   REHAB POTENTIAL: Good  CLINICAL DECISION MAKING: Stable/uncomplicated  EVALUATION COMPLEXITY: Low   GOALS: Goals reviewed with patient? Yes  SHORT TERM GOALS: Target date: 08/01/22 Independent with initial HEP Goal status: still not doing 09/11/22   LONG TERM GOALS: Target date: 10/10/22  Independent with advanced HEP Goal status: ongoing 08/22/22  2.  Perform stand pivot transfer independently Goal status:ongoing 08/28/22  3.  Maintain knee ROM to within 25 degrees of full extension Goal status: ongoing 08/22/22  4.  Independent with bed mobility and transfers Goal status: progressing CGA 08/22/22  5.  Do a sliding board transfer into the car Goal status: ongoing 08/28/22  PLAN:  PT FREQUENCY: 1-2x/week  PT DURATION: 12 weeks  PLANNED INTERVENTIONS: Therapeutic exercises, Therapeutic activity, Neuromuscular re-education, Balance training, Gait  training, Patient/Family education, Self Care, Joint mobilization, Dry Needling, Electrical stimulation, and Manual therapy  PLAN FOR NEXT SESSION: really work on standing, she really cannot activate the quads, work on passive [employer], PT 09/11/2022, 1:53 PM

## 2022-09-13 ENCOUNTER — Encounter: Payer: Self-pay | Admitting: Physical Therapy

## 2022-09-18 ENCOUNTER — Ambulatory Visit: Payer: Medicaid Other | Admitting: Physical Therapy

## 2022-09-18 ENCOUNTER — Encounter: Payer: Self-pay | Admitting: Physical Therapy

## 2022-09-18 DIAGNOSIS — R531 Weakness: Secondary | ICD-10-CM

## 2022-09-18 DIAGNOSIS — M62830 Muscle spasm of back: Secondary | ICD-10-CM

## 2022-09-18 DIAGNOSIS — R252 Cramp and spasm: Secondary | ICD-10-CM

## 2022-09-18 NOTE — Therapy (Signed)
OUTPATIENT PHYSICAL THERAPY LOWER EXTREMITY TREATMENT   Patient Name: Melissa Wagner MRN: [id number] DOB:[birth year], 27 y.o., female Today's Date: 09/18/2022  END OF SESSION:  PT End of Session - 09/18/22 1143     Visit Number 9    Date for PT Re-Evaluation 10/10/22    Authorization Type CC[employer] 5 of 8 10/16/22    PT Start Time 1055    PT Stop Time 1152    PT Time Calculation (min) 57 min    Activity Tolerance Patient tolerated treatment well    Behavior During Therapy WFL for tasks assessed/performed               Past Medical History:  Diagnosis Date   Anxiety    Cerebral palsy (HCC)    Dysmenorrhea    Past Surgical History:  Procedure Laterality Date   ADDUCTOR RELEASE     at age 66   dorsal rhizotomy     at age 30   HAMSTRING LENGTHENING     at the age 42   Patient Active Problem List   Diagnosis Date Noted   Piriformis syndrome of left side 08/23/2022   Anxiety 07/22/2022   Contracture of both hamstrings 01/24/2022   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/24/2022   Chronic migraine w/o aura w/o status migrainosus, not intractable 11/15/2021   Spastic diplegic cerebral palsy (HCC) 11/15/2021   Chronic migraine without aura without status migrainosus, not intractable 08/06/2021   Spasticity 10/11/2019   Jaw pain 07/07/2019   Wheelchair dependence 06/18/2019   Diplegic cerebral palsy (HCC) 06/18/2019   Myofascial pain 06/18/2019   Pain of right scapula 06/18/2019    PCP: Melissa Rouge, MD  REFERRING PROVIDER: Genice Rouge, MD  REFERRING DIAG: s/p HS, adductor and iliopsoas resection due to contractures from CP  THERAPY DIAG:  Weakness generalized  Spasticity  Muscle spasm of back  Rationale for Evaluation and Treatment: Rehabilitation  ONSET DATE: 05/22/22  SUBJECTIVE:   SUBJECTIVE STATEMENT: Patient reports that last Friday she started having some left groin/hip pain, she was unsure why, today she still has some but mostly with weight bearing  during transfers and with rotation PERTINENT HISTORY: Patient is a 27 yr old R handed female with CP- spastic diplegia and has generalized anxiety disorder- On Sertraline for anxiety. Also has myofascial pain Here for f/u on diplegic CP and also here for TrP injections for pain control.  PAIN:  Are you having pain? Yes right upper trap, neck and rhomboid area, has had theis pain prior to surgery, gets trigger point injections, she has hip painwith sitting and with lying on the left side, pain in the hips and in the lower legs  PRECAUTIONS: None  WEIGHT BEARING RESTRICTIONS: No  FALLS:  Has patient fallen in last 6 months? Yes. Number of falls 1  LIVING ENVIRONMENT: Lives with: lives with their family Lives in: House/apartment Stairs: No Has following equipment at home: Wheelchair (power)  OCCUPATION: graduated with masters degree in SW  PLOF:  prior to surgery she was independent with toilet transfers, reports cannot do this now requires max A now.  She reports independent with bed tranfers and mobility, needs max A now    PATIENT GOALS: toilet transfer on own, keep LE mms stretches  NEXT MD VISIT: next week  OBJECTIVE:    COGNITION: Overall cognitive status: Within functional limits for tasks assessed     SENSATION: WFL   MUSCLE LENGTH: Tight HS, hip flexor, ankles and adductors are tight  PALPATION: Very tight and tender in the neck, upper traps and the rhomboids, she is tender in the surgical areas as well  LOWER EXTREMITY ROM:  Passive ROM Right eval Left eval R/L 08/22/22  Hip flexion     Hip extension Cannot get to neutral " 5/5  Hip abduction 10 10   Hip adduction     Hip internal rotation     Hip external rotation     Knee flexion     Knee extension 25 30 20/25  Ankle dorsiflexion 0 0   Ankle plantarflexion     Ankle inversion     Ankle eversion      (Blank rows = not tested)  LOWER EXTREMITY MMT:  UE strength 4+/5  MMT Right eval  Left eval  Hip flexion 3 3  Hip extension    Hip abduction    Hip adduction    Hip internal rotation    Hip external rotation    Knee flexion    Knee extension 2 2  Ankle dorsiflexion 2 3  Ankle plantarflexion    Ankle inversion    Ankle eversion     (Blank rows = not tested) GAIT: Distance walked: unable  TRANSFERS:  Sit to stand Max A Standing Max A with a walker Car transfer is Max A where her mom lifts her from the chair and puts her in the car. When standing her knees are not strong enough to hold her and flex, she also flexes her trunk, needs knees blocked and then help with pressure on her buttocks to get upright  TODAY'S TREATMENT:                                                                                                                              DATE:   09/18/22 She was in the mechanized chair today and we used it for standing x 10 minutes total with PT facilitating some increase of knee extension, hip extension and then PT doing some Pectoral stretches from behind her  Transferred with set up and CGA to the mat table PROM to the LE's, knees, ankles, hips(adductors and rotation) Sliding board active hip abduction, some hip ER/IR actively Bridges Red tband clamshells Green tband hip extension in supine Gentle STM to the right upper trap and rhomboid MHP/IFC to this area in sitting at end of treatment  09/11/22 Nustep level 3 x 6 minutes Passive stretch of the HS, adductors and hip flexors STM to the right upper trap, necka nd rhomboids Standing with ball b/n knees, knees blocked and PT hands on hips working on weight bearing and knee extension and back extensio Transfers with Min A mostly set up, I did try to fix the brakes on the W/C but it did not work as it seems the tread on the tire is worn out  09/04/22 Min A transfers Nustep level 5 x 5 mintues Standing with 6" step, 2 persone one in front  with hand on buttocks to bring hips forward and the other behind  with hands on knees to get extension 3 x 1 minute ball b/n knees Supine passive stretch of the HS, hip flexor, adductors, calves  Henreitta Leber SAQ with assist some eccentrics Did bathroom standing using the bar Talked about chair safety as her brakes are not working  08/28/22 Transfer to mat table in OT room Min A from the smaller manual chair Passive stretch to the LE's in supine Bridges Partial sit ups with assist Standing with ball b/n knees, PT blocking knees from forward PT hands on the back side and then verbal cues for hips forward and to get tall and chest up and out, stood about 1 minute x4, also did partial sit to stands and hips forward and back and knees Tried SAQ with assist  08/22/22 Transfer CGA from motorized chair to table Passive stretch of the HS, adductors, hip flexors and piriformis Talked with her about assuring to do the stretches at home and how important that is after the surgery. Transfer CGA back to chair STM to the upper traps the neck and the rhomboids, very tight and tender Estim to the upper traps and rhomboids for pain and spasm relief  08/08/22 Transfer wc<>NuStep minA  NuStep L3 x  Transfer wc<> mat table Heel slides 2x10 SLR x10, unable to get knee to go into ext  Stretching HS, adductors  Quadruped 30s  Seated marches 2#- minimal movement  08/01/22 Transfer to mat- stand pivot  Bed mobility rolling side to side  Bridges 2x10  Ab roll up with ball 2x10  Passive HS stretching 30s x2 each side  Banded hip abd yellow x10 Supine ball squeezes 2x10 Hip abd on slide board 2x10 Sidelying passive hip flexor stretch 30s x2 each side    07/23/22 Chair to mat transfer and back at end of visit LAQ- min activation and movement unable to do with good form so we stopped HS curls- partial ranges with yellow TB x5 each side  Seated hip abd isometric x5 with 5s hold against PT hand Passive HS stretch 30s x2 bilaterally  Hip abd stretch 30s bilaterally   SAQ x10  SLR x10 Bridges 2x10 LLLD stretch 5# 3 mins    PATIENT EDUCATION:  Education details: POC/HEP Person educated: Patient and Parent Education method: Explanation, Demonstration, Actor cues, Verbal cues, and Handouts Education comprehension: verbalized understanding  HOME EXERCISE PROGRAM: Access Code: [email or web address] URL: [email or web address] Date: 07/11/2022 Prepared by: Stacie Glaze  Exercises - Seated Hamstring Stretch with Chair  - 1 x daily - 7 x weekly - 2 sets - 10 reps - 30 hold - Supine Hip Adductor Stretch  - 1 x daily - 7 x weekly - 2 sets - 10 reps - 30 hold  ASSESSMENT:  CLINICAL IMPRESSION: Patient with chair that will allow her to be in standing so we utilized this today, she did not have pain in the hip and I encouraged her to do this at least 10 minutes a day to help with the ROM in the ankles, knees and hips.  She is gaining PROM of the knees, with some force I can get them to about 10 degrees from full extension.  She is still having some back pain.  She has  picture of how she was prior to surgery and she seems to have quite a bit better motions that should only help her transfers, the biggest issue is we cannot get active knee extension, tends  to use hip flexor OBJECTIVE IMPAIRMENTS: Abnormal gait, cardiopulmonary status limiting activity, decreased activity tolerance, decreased balance, decreased coordination, decreased endurance, decreased mobility, difficulty walking, decreased ROM, decreased strength, increased fascial restrictions, increased muscle spasms, impaired flexibility, postural dysfunction, and pain.   REHAB POTENTIAL: Good  CLINICAL DECISION MAKING: Stable/uncomplicated  EVALUATION COMPLEXITY: Low   GOALS: Goals reviewed with patient? Yes  SHORT TERM GOALS: Target date: 08/01/22 Independent with initial HEP Goal status: still not doing 09/11/22   LONG TERM GOALS: Target date: 10/10/22  Independent with advanced  HEP Goal status: ongoing 08/22/22  2.  Perform stand pivot transfer independently Goal status:ongoing 08/28/22  3.  Maintain knee ROM to within 25 degrees of full extension Goal status: progressing 09/18/22  4.  Independent with bed mobility and transfers Goal status: progressing CGA 09/18/22  5.  Do a sliding board transfer into the car Goal status: ongoing 08/28/22  PLAN:  PT FREQUENCY: 1-2x/week  PT DURATION: 12 weeks  PLANNED INTERVENTIONS: Therapeutic exercises, Therapeutic activity, Neuromuscular re-education, Balance training, Gait training, Patient/Family education, Self Care, Joint mobilization, Dry Needling, Electrical stimulation, and Manual therapy  PLAN FOR NEXT SESSION: really work on standing, she really cannot activate the quads, work on passive [employer], PT 09/18/2022, 11:55 AM

## 2022-09-24 ENCOUNTER — Telehealth: Payer: Self-pay | Admitting: *Deleted

## 2022-09-24 ENCOUNTER — Ambulatory Visit: Payer: Medicaid Other

## 2022-09-24 DIAGNOSIS — R531 Weakness: Secondary | ICD-10-CM

## 2022-09-24 DIAGNOSIS — M62830 Muscle spasm of back: Secondary | ICD-10-CM

## 2022-09-24 DIAGNOSIS — R252 Cramp and spasm: Secondary | ICD-10-CM

## 2022-09-24 NOTE — Telephone Encounter (Signed)
Prior auth for dantrolene submitted to Medicaid via [employer].

## 2022-09-24 NOTE — Therapy (Signed)
OUTPATIENT PHYSICAL THERAPY LOWER EXTREMITY TREATMENT Progress Note Reporting Period 07/11/22 to 09/24/22  See note below for Objective Data and Assessment of Progress/Goals.      Patient Name: Melissa Wagner MRN: [id number] DOB:[birth year], 27 y.o., female Today's Date: 09/18/2022  END OF SESSION:  PT End of Session - 09/18/22 1143     Visit Number 9    Date for PT Re-Evaluation 10/10/22    Authorization Type CCME 5 of 8 10/16/22    PT Start Time 1055    PT Stop Time 1152    PT Time Calculation (min) 57 min    Activity Tolerance Patient tolerated treatment well    Behavior During Therapy WFL for tasks assessed/performed               Past Medical History:  Diagnosis Date   Anxiety    Cerebral palsy (HCC)    Dysmenorrhea    Past Surgical History:  Procedure Laterality Date   ADDUCTOR RELEASE     at age 37   dorsal rhizotomy     at age 87   HAMSTRING LENGTHENING     at the age 48   Patient Active Problem List   Diagnosis Date Noted   Piriformis syndrome of left side 08/23/2022   Anxiety 07/22/2022   Contracture of both hamstrings 01/24/2022   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/24/2022   Chronic migraine w/o aura w/o status migrainosus, not intractable 11/15/2021   Spastic diplegic cerebral palsy (HCC) 11/15/2021   Chronic migraine without aura without status migrainosus, not intractable 08/06/2021   Spasticity 10/11/2019   Jaw pain 07/07/2019   Wheelchair dependence 06/18/2019   Diplegic cerebral palsy (HCC) 06/18/2019   Myofascial pain 06/18/2019   Pain of right scapula 06/18/2019    PCP: Genice Rouge, MD  REFERRING PROVIDER: Genice Rouge, MD  REFERRING DIAG: s/p HS, adductor and iliopsoas resection due to contractures from CP  THERAPY DIAG:  Weakness generalized  Spasticity  Muscle spasm of back  Rationale for Evaluation and Treatment: Rehabilitation  ONSET DATE: 05/22/22  SUBJECTIVE:   SUBJECTIVE STATEMENT: My insurance is not covering  my antispasmodic so I have to take a PRN and it makes me really sleepy.     PERTINENT HISTORY: Patient is a 27 yr old R handed female with CP- spastic diplegia and has generalized anxiety disorder- On Sertraline for anxiety. Also has myofascial pain Here for f/u on diplegic CP and also here for TrP injections for pain control.  PAIN:  Are you having pain? Yes right upper trap, neck and rhomboid area, has had theis pain prior to surgery, gets trigger point injections, she has hip painwith sitting and with lying on the left side, pain in the hips and in the lower legs  PRECAUTIONS: None  WEIGHT BEARING RESTRICTIONS: No  FALLS:  Has patient fallen in last 6 months? Yes. Number of falls 1  LIVING ENVIRONMENT: Lives with: lives with their family Lives in: House/apartment Stairs: No Has following equipment at home: Wheelchair (power)  OCCUPATION: graduated with masters degree in SW  PLOF:  prior to surgery she was independent with toilet transfers, reports cannot do this now requires max A now.  She reports independent with bed tranfers and mobility, needs max A now    PATIENT GOALS: toilet transfer on own, keep LE mms stretches  NEXT MD VISIT: next week  OBJECTIVE:    COGNITION: Overall cognitive status: Within functional limits for tasks assessed     SENSATION: [facility]  MUSCLE LENGTH: Tight HS, hip flexor, ankles and adductors are tight  PALPATION: Very tight and tender in the neck, upper traps and the rhomboids, she is tender in the surgical areas as well  LOWER EXTREMITY ROM:  Passive ROM Right eval Left eval R/L 08/22/22  Hip flexion     Hip extension Cannot get to neutral " 5/5  Hip abduction 10 10   Hip adduction     Hip internal rotation     Hip external rotation     Knee flexion     Knee extension 25 30 20/25  Ankle dorsiflexion 0 0   Ankle plantarflexion     Ankle inversion     Ankle eversion      (Blank rows = not tested)  LOWER EXTREMITY  MMT:  UE strength 4+/5  MMT Right eval Left eval  Hip flexion 3 3  Hip extension    Hip abduction    Hip adduction    Hip internal rotation    Hip external rotation    Knee flexion    Knee extension 2 2  Ankle dorsiflexion 2 3  Ankle plantarflexion    Ankle inversion    Ankle eversion     (Blank rows = not tested) GAIT: Distance walked: unable  TRANSFERS:  Sit to stand Max A Standing Max A with a walker Car transfer is Max A where her mom lifts her from the chair and puts her in the car. When standing her knees are not strong enough to hold her and flex, she also flexes her trunk, needs knees blocked and then help with pressure on her buttocks to get upright  TODAY'S TREATMENT:                                                                                                                              DATE:   09/24/22 NuStep L5 x22mins  Reviewed goals  SAQ with help x10 each side  Bridges 2x10  Red tband clamshells 2x10 Green band hip ext in supine 2x10 In chair cable extension and rows 5# 2x10   09/18/22 She was in the mechanized chair today and we used it for standing x 10 minutes total with PT facilitating some increase of knee extension, hip extension and then PT doing some Pectoral stretches from behind her  Transferred with set up and CGA to the mat table PROM to the LE's, knees, ankles, hips(adductors and rotation) Sliding board active hip abduction, some hip ER/IR actively Bridges Red tband clamshells Green tband hip extension in supine Gentle STM to the right upper trap and rhomboid MHP/IFC to this area in sitting at end of treatment  09/11/22 Nustep level 3 x 6 minutes Passive stretch of the HS, adductors and hip flexors STM to the right upper trap, necka nd rhomboids Standing with ball b/n knees, knees blocked and PT hands on hips working on weight bearing and knee extension and back extensio Transfers with  Min A mostly set up, I did try to fix the brakes  on the W/C but it did not work as it seems the tread on the tire is worn out  09/04/22 Min A transfers Nustep level 5 x 5 mintues Standing with 6" step, 2 persone one in front with hand on buttocks to bring hips forward and the other behind with hands on knees to get extension 3 x 1 minute ball b/n knees Supine passive stretch of the HS, hip flexor, adductors, calves  Henreitta Leber SAQ with assist some eccentrics Did bathroom standing using the bar Talked about chair safety as her brakes are not working  08/28/22 Transfer to mat table in OT room Min A from the smaller manual chair Passive stretch to the LE's in supine Bridges Partial sit ups with assist Standing with ball b/n knees, PT blocking knees from forward PT hands on the back side and then verbal cues for hips forward and to get tall and chest up and out, stood about 1 minute x4, also did partial sit to stands and hips forward and back and knees Tried SAQ with assist  08/22/22 Transfer CGA from motorized chair to table Passive stretch of the HS, adductors, hip flexors and piriformis Talked with her about assuring to do the stretches at home and how important that is after the surgery. Transfer CGA back to chair STM to the upper traps the neck and the rhomboids, very tight and tender Estim to the upper traps and rhomboids for pain and spasm relief  08/08/22 Transfer wc<>NuStep minA  NuStep L3 x  Transfer wc<> mat table Heel slides 2x10 SLR x10, unable to get knee to go into ext  Stretching HS, adductors  Quadruped 30s  Seated marches 2#- minimal movement  08/01/22 Transfer to mat- stand pivot  Bed mobility rolling side to side  Bridges 2x10  Ab roll up with ball 2x10  Passive HS stretching 30s x2 each side  Banded hip abd yellow x10 Supine ball squeezes 2x10 Hip abd on slide board 2x10 Sidelying passive hip flexor stretch 30s x2 each side    07/23/22 Chair to mat transfer and back at end of visit LAQ- min activation  and movement unable to do with good form so we stopped HS curls- partial ranges with yellow TB x5 each side  Seated hip abd isometric x5 with 5s hold against PT hand Passive HS stretch 30s x2 bilaterally  Hip abd stretch 30s bilaterally  SAQ x10  SLR x10 Bridges 2x10 LLLD stretch 5# 3 mins    PATIENT EDUCATION:  Education details: POC/HEP Person educated: Patient and Parent Education method: Explanation, Demonstration, Actor cues, Verbal cues, and Handouts Education comprehension: verbalized understanding  HOME EXERCISE PROGRAM: Access Code: [email or web address] URL: [email or web address] Date: 07/11/2022 Prepared by: Stacie Glaze  Exercises - Seated Hamstring Stretch with Chair  - 1 x daily - 7 x weekly - 2 sets - 10 reps - 30 hold - Supine Hip Adductor Stretch  - 1 x daily - 7 x weekly - 2 sets - 10 reps - 30 hold  ASSESSMENT:  CLINICAL IMPRESSION: Patient is using manual chair again, but it is missing an arm. With 10th visit progress note, she has made some progress towards her goals especially in regards to completing transfers but still has difficulty with weight bearing through her legs. Patient expressed interest in wanting to try cables today. Does well in regards to weight but needs some cues to control.    OBJECTIVE  IMPAIRMENTS: Abnormal gait, cardiopulmonary status limiting activity, decreased activity tolerance, decreased balance, decreased coordination, decreased endurance, decreased mobility, difficulty walking, decreased ROM, decreased strength, increased fascial restrictions, increased muscle spasms, impaired flexibility, postural dysfunction, and pain.   REHAB POTENTIAL: Good  CLINICAL DECISION MAKING: Stable/uncomplicated  EVALUATION COMPLEXITY: Low   GOALS: Goals reviewed with patient? Yes  SHORT TERM GOALS: Target date: 08/01/22 Independent with initial HEP Goal status: still not doing 09/11/22, doing the stretches 09/24/22    LONG TERM GOALS:  Target date: 10/10/22  Independent with advanced HEP Goal status: ongoing 08/22/22  2.  Perform stand pivot transfer independently Goal status:ongoing 08/28/22, MET 09/24/22  3.  Maintain knee ROM to within 25 degrees of full extension Goal status: progressing 09/18/22  4.  Independent with bed mobility and transfers Goal status: progressing CGA 09/18/22  5.  Do a sliding board transfer into the car Goal status: ongoing 08/28/22, ongoing 09/24/22  PLAN:  PT FREQUENCY: 1-2x/week  PT DURATION: 12 weeks  PLANNED INTERVENTIONS: Therapeutic exercises, Therapeutic activity, Neuromuscular re-education, Balance training, Gait training, Patient/Family education, Self Care, Joint mobilization, Dry Needling, Electrical stimulation, and Manual therapy  PLAN FOR NEXT SESSION: really work on standing, she really cannot activate the quads, work on passive [employer], PT 09/18/2022, 11:55 AM

## 2022-09-24 NOTE — Telephone Encounter (Signed)
Status:APPROVEDEffective Begin Date:06/25/2024Effective End Date:09/24/2023. Pharmacy notified.

## 2022-10-01 ENCOUNTER — Ambulatory Visit: Payer: MEDICAID

## 2022-10-04 ENCOUNTER — Encounter: Payer: MEDICAID | Attending: Physical Medicine and Rehabilitation | Admitting: Physical Medicine and Rehabilitation

## 2022-10-04 ENCOUNTER — Encounter: Payer: Self-pay | Admitting: Physical Medicine and Rehabilitation

## 2022-10-04 VITALS — BP 134/86 | HR 111 | Ht <= 58 in

## 2022-10-04 DIAGNOSIS — G801 Spastic diplegic cerebral palsy: Secondary | ICD-10-CM | POA: Diagnosis present

## 2022-10-04 DIAGNOSIS — M7918 Myalgia, other site: Secondary | ICD-10-CM | POA: Diagnosis not present

## 2022-10-04 DIAGNOSIS — Z993 Dependence on wheelchair: Secondary | ICD-10-CM | POA: Insufficient documentation

## 2022-10-04 DIAGNOSIS — M898X1 Other specified disorders of bone, shoulder: Secondary | ICD-10-CM | POA: Diagnosis not present

## 2022-10-04 MED ORDER — LIDOCAINE HCL 1 % IJ SOLN
6.0000 mL | Freq: Once | INTRAMUSCULAR | Status: AC
Start: 2022-10-04 — End: ?

## 2022-10-04 NOTE — Patient Instructions (Signed)
Plan: Make sure w/c cushion fits w/c.  Please bring in w/c and see if fits OK- transport w/c with ability to push w/c with wheels.  Uses [employer] [facility]- for w/c vendor.    4.  Let her try Orthotics and see how they work.    5. Suggest [employer].  Can cause you more spasticity- think about placement of of tattoo, but also think about location of tattoo artist- I agree with [facility]-  they also do the Lidocaine for tattoos- use 10% lidocaine- have ot get there 90 minutes early for tattoo.   6. CMP due today- for f/u on her Lft's- has been 6 months.    7. Patient here for trigger point injections for  Consent done and on chart.  Cleaned areas with alcohol and injected using a 27 gauge 1.5 inch needle  Injected 5cc- 1cc wasted Using 1% Lidocaine with no EPI  Upper traps B/L  Levators- R only Posterior scalenes Middle scalenes- B/L  Splenius Capitus Pectoralis Major Rhomboids- 8x on R and 2x on L- was sticking up  >1 inch but now down to <1/2 cm Infraspinatus Teres Major/minor Thoracic paraspinals Lumbar paraspinals Other injections-    Patient's level of pain prior was 5/10 Current level of pain after injections is still feels like pincushion.   There was no bleeding or complications.  Patient was advised to drink a lot of water on day after injections to flush system Will have increased soreness for 12-48 hours after injections.  Can use Lidocaine patches the day AFTER injections Can use theracane on day of injections in places didn't inject Can use heating pad 4-6 hours AFTER injections  8. F/U in 6 weeks- for trigger point injections and f/u on CP/spasticity.

## 2022-10-04 NOTE — Progress Notes (Signed)
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 Also has myofascial pain Here for f/u on diplegic CP and also here for TrP injections for pain control.                    Not a lot of change since last saw me.   Got issue with Dantrolene- pharmacy shorted her 1/2 of Dantrolene dose- got 45 days not 90 days- was fixed this AM.   Scared of changing pharmacies -doesn't want to get there and get a worse pharmacy.   Has new manual w/c coming- ordered off [employer].  Her armrests have broken- no way to fix them.  So ordered a manual w/c for $250- comes with seatbelt- needs lateral pads- out of pocket.   A new manual W/c from "healthcare equipment". - would not be foldable and was $500. So couldn't get it- so ordered from [employer].    Orthotics from surgeon came in today- has delivery for those next week.  Doesn't want them, and only surgeon thinks she needs them.    Spasticity- about the same- continues with Dantrolene.    Slept on neck wrong- and in pain B/L.  Willing to get TrP injections to help.   Making progress in PT-   Plan: Make sure w/c cushion fits w/c.  Please bring in w/c and see if fits OK- transport w/c with ability to push w/c with wheels.  Uses Healthcare [facility]- for w/c vendor.    4.  Let her try Orthotics and see how they work.    5. Suggest [employer].  Can cause you more spasticity- think about placement of of tattoo, but also think about location of tattoo artist- I agree with [facility]-  they also do the Lidocaine for tattoos- use 10% lidocaine- have ot get there 90 minutes early for tattoo.   6. CMP due today- for f/u on her Lft's- has been 6 months.    7. Patient here for trigger point injections for  Consent done and on chart.  Cleaned areas with alcohol and injected using a 27 gauge 1.5 inch needle  Injected 5cc- 1cc wasted Using 1% Lidocaine with no EPI  Upper  traps B/L  Levators- R only Posterior scalenes Middle scalenes- B/L  Splenius Capitus Pectoralis Major Rhomboids- 8x on R and 2x on L- was sticking up  >1 inch but now down to <1/2 cm Infraspinatus Teres Major/minor Thoracic paraspinals Lumbar paraspinals Other injections-    Patient's level of pain prior was 5/10 Current level of pain after injections is still feels like pincushion.   There was no bleeding or complications.  Patient was advised to drink a lot of water on day after injections to flush system Will have increased soreness for 12-48 hours after injections.  Can use Lidocaine patches the day AFTER injections Can use theracane on day of injections in places didn't inject Can use heating pad 4-6 hours AFTER injections  8. F/U in 6 weeks- for trigger point injections and f/u on CP/spasticity.   9. Use theracane- did myofascial release on L scalenes-     I spent a total of 38   minutes on total care today- >50% coordination of care- due to 8 minutes son injections and 30 minutes discussing /doing myofascial release, CMP and theracane as well as Dantrolene and home issues and w/c

## 2022-10-05 LAB — COMPREHENSIVE METABOLIC PANEL
ALT: 7 IU/L (ref 0–32)
AST: 12 IU/L (ref 0–40)
Albumin: 4.5 g/dL (ref 4.0–5.0)
Alkaline Phosphatase: 54 IU/L (ref 44–121)
BUN/Creatinine Ratio: 19 (ref 9–23)
BUN: 9 mg/dL (ref 6–20)
Bilirubin Total: <0.2 mg/dL (ref 0.0–1.2)
CO2: 21 mmol/L (ref 20–29)
Calcium: 9.7 mg/dL (ref 8.7–10.2)
Chloride: 102 mmol/L (ref 96–106)
Creatinine, Ser: 0.47 mg/dL — ABNORMAL LOW (ref 0.57–1.00)
Globulin, Total: 2.6 g/dL (ref 1.5–4.5)
Glucose: 89 mg/dL (ref 70–99)
Potassium: 4.5 mmol/L (ref 3.5–5.2)
Sodium: 138 mmol/L (ref 134–144)
Total Protein: 7.1 g/dL (ref 6.0–8.5)
eGFR: 136 mL/min/{1.73_m2} (ref >59)

## 2022-10-09 ENCOUNTER — Ambulatory Visit: Payer: MEDICAID | Attending: Orthopedic Surgery | Admitting: Physical Therapy

## 2022-10-09 ENCOUNTER — Encounter: Payer: Self-pay | Admitting: Physical Therapy

## 2022-10-09 DIAGNOSIS — R531 Weakness: Secondary | ICD-10-CM | POA: Diagnosis present

## 2022-10-09 DIAGNOSIS — M62830 Muscle spasm of back: Secondary | ICD-10-CM | POA: Insufficient documentation

## 2022-10-09 DIAGNOSIS — R252 Cramp and spasm: Secondary | ICD-10-CM | POA: Diagnosis present

## 2022-10-09 NOTE — Therapy (Addendum)
OUTPATIENT PHYSICAL THERAPY LOWER EXTREMITY TREATMENT    Patient Name: Melissa Wagner MRN: [id number] DOB:1996-02-02, 27 y.o., female Today's Date: 10/09/2022  END OF SESSION:  PT End of Session - 10/09/22 1612     Visit Number 11    Authorization Type [employer]    PT Start Time [phone]    PT Stop Time 1700    PT Time Calculation (min) 47 min    Activity Tolerance Patient tolerated treatment well               Past Medical History:  Diagnosis Date   Anxiety    Cerebral palsy (HCC)    Dysmenorrhea    Past Surgical History:  Procedure Laterality Date   ADDUCTOR RELEASE     at age 72   dorsal rhizotomy     at age 89   HAMSTRING LENGTHENING     at the age 39   Patient Active Problem List   Diagnosis Date Noted   Piriformis syndrome of left side 08/23/2022   Anxiety 07/22/2022   Contracture of both hamstrings 01/24/2022   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/24/2022   Chronic migraine w/o aura w/o status migrainosus, not intractable 11/15/2021   Spastic diplegic cerebral palsy (HCC) 11/15/2021   Chronic migraine without aura without status migrainosus, not intractable 08/06/2021   Spasticity 10/11/2019   Jaw pain 07/07/2019   Wheelchair dependence 06/18/2019   Diplegic cerebral palsy (HCC) 06/18/2019   Myofascial pain 06/18/2019   Pain of right scapula 06/18/2019    PCP: Genice Rouge, MD  REFERRING PROVIDER: Genice Rouge, MD  REFERRING DIAG: s/p HS, adductor and iliopsoas resection due to contractures from CP  THERAPY DIAG:  Weakness generalized  Spasticity  Muscle spasm of back  Rationale for Evaluation and Treatment: Rehabilitation  ONSET DATE: 05/22/22  SUBJECTIVE:   SUBJECTIVE STATEMENT: Patient reports that she has had some transportation issues and has not been in to PT in a week.  She did see the MD recently, Lelia ordered her own manual chair and needs some help with it, she had trigger point injections as well, she also did get braces for th  LE's   PERTINENT HISTORY: Patient is a 27 yr old R handed female with CP- spastic diplegia and has generalized anxiety disorder- On Sertraline for anxiety. Also has myofascial pain Here for f/u on diplegic CP and also here for TrP injections for pain control.  PAIN:  Are you having pain? Yes right upper trap, neck and rhomboid area, has had theis pain prior to surgery, gets trigger point injections, she has hip painwith sitting and with lying on the left side, pain in the hips and in the lower legs  PRECAUTIONS: None  WEIGHT BEARING RESTRICTIONS: No  FALLS:  Has patient fallen in last 6 months? Yes. Number of falls 1  LIVING ENVIRONMENT: Lives with: lives with their family Lives in: House/apartment Stairs: No Has following equipment at home: Wheelchair (power)  OCCUPATION: graduated with masters degree in SW  PLOF:  prior to surgery she was independent with toilet transfers, reports cannot do this now requires max A now.  She reports independent with bed tranfers and mobility, needs max A now    PATIENT GOALS: toilet transfer on own, keep LE mms stretches  NEXT MD VISIT: next week  OBJECTIVE:    COGNITION: Overall cognitive status: Within functional limits for tasks assessed     SENSATION: WFL   MUSCLE LENGTH: Tight HS, hip flexor, ankles and adductors  are tight  PALPATION: Very tight and tender in the neck, upper traps and the rhomboids, she is tender in the surgical areas as well  LOWER EXTREMITY ROM:  Passive ROM Right eval Left eval R/L 08/22/22 R/L 10/09/22  Hip flexion      Hip extension Cannot get to neutral " 5/5   Hip abduction 10 10  25/20  Hip adduction      Hip internal rotation      Hip external rotation      Knee flexion      Knee extension 25 30 20/25 18/13  Ankle dorsiflexion 0 0    Ankle plantarflexion      Ankle inversion      Ankle eversion       (Blank rows = not tested)  LOWER EXTREMITY MMT:  UE strength 4+/5  MMT Right eval  Left eval  Hip flexion 3 3  Hip extension    Hip abduction    Hip adduction    Hip internal rotation    Hip external rotation    Knee flexion    Knee extension 2 2  Ankle dorsiflexion 2 3  Ankle plantarflexion    Ankle inversion    Ankle eversion     (Blank rows = not tested) GAIT: Distance walked: unable  TRANSFERS:  Sit to stand Max A Standing Max A with a walker Car transfer is Max A where her mom lifts her from the chair and puts her in the car. When standing her knees are not strong enough to hold her and flex, she also flexes her trunk, needs knees blocked and then help with pressure on her buttocks to get upright  TODAY'S TREATMENT:                                                                                                                              DATE:   10/08/22 Nustep Level 5 x 6 minutes While she was doing the [employer] I worked on the chair, she bought the chair out of pocket on [employer] and it does not have lateral trunk supports, she bought these off of [employer] but her mom could not figure them out, I was able to put them on but due to the narrowness of the chair only one would work and she wanted it on the right side to help with support Passive stretch of the LE's Standing with her holding PT shoulders on 6" step, PT blocking knees and providing hip extension to neutral and having her use her trunk for posture, 2 minutes x 2 Red tband clamshells bridges  09/24/22 NuStep L5 x32mins  Reviewed goals  SAQ with help x10 each side  Bridges 2x10  Red tband clamshells 2x10 Green band hip ext in supine 2x10 In chair cable extension and rows 5# 2x10   09/18/22 She was in the mechanized chair today and we used it for standing x 10 minutes total with PT facilitating  some increase of knee extension, hip extension and then PT doing some Pectoral stretches from behind her  Transferred with set up and CGA to the mat table PROM to the LE's, knees, ankles, hips(adductors and  rotation) Sliding board active hip abduction, some hip ER/IR actively Bridges Red tband clamshells Green tband hip extension in supine Gentle STM to the right upper trap and rhomboid MHP/IFC to this area in sitting at end of treatment  09/11/22 Nustep level 3 x 6 minutes Passive stretch of the HS, adductors and hip flexors STM to the right upper trap, necka nd rhomboids Standing with ball b/n knees, knees blocked and PT hands on hips working on weight bearing and knee extension and back extensio Transfers with Min A mostly set up, I did try to fix the brakes on the W/C but it did not work as it seems the tread on the tire is worn out  09/04/22 Min A transfers Nustep level 5 x 5 mintues Standing with 6" step, 2 persone one in front with hand on buttocks to bring hips forward and the other behind with hands on knees to get extension 3 x 1 minute ball b/n knees Supine passive stretch of the HS, hip flexor, adductors, calves  Henreitta Leber SAQ with assist some eccentrics Did bathroom standing using the bar Talked about chair safety as her brakes are not working  08/28/22 Transfer to mat table in OT room Min A from the smaller manual chair Passive stretch to the LE's in supine Bridges Partial sit ups with assist Standing with ball b/n knees, PT blocking knees from forward PT hands on the back side and then verbal cues for hips forward and to get tall and chest up and out, stood about 1 minute x4, also did partial sit to stands and hips forward and back and knees Tried SAQ with assist  PATIENT EDUCATION:  Education details: POC/HEP Person educated: Patient and Parent Education method: Explanation, Demonstration, Actor cues, Verbal cues, and Handouts Education comprehension: verbalized understanding  HOME EXERCISE PROGRAM: Access Code: [email or web address] URL: [email or web address] Date: 07/11/2022 Prepared by: Stacie Glaze  Exercises - Seated Hamstring Stretch with Chair  - 1 x  daily - 7 x weekly - 2 sets - 10 reps - 30 hold - Supine Hip Adductor Stretch  - 1 x daily - 7 x weekly - 2 sets - 10 reps - 30 hold  ASSESSMENT:  CLINICAL IMPRESSION: Patient bought out of pocket a manual w/c, she needed some help today with adjustments to the chair and to place trunk support.  I took measurements and she has gained motion in knee extension and hip abduction passively.  We are starting to work on standing and I think she is much safer now with the new w/c, she still has the electric one but it is very large and they have difficulty with it if they do not use ride share that is w/c accessible, especially if her mom is picking her up.  I would like to see the braces and help her with this and continue the standing and further problem solve for her to recover after the surgeries that she had and as she continues to pursue higher education this next semester.  OBJECTIVE IMPAIRMENTS: Abnormal gait, cardiopulmonary status limiting activity, decreased activity tolerance, decreased balance, decreased coordination, decreased endurance, decreased mobility, difficulty walking, decreased ROM, decreased strength, increased fascial restrictions, increased muscle spasms, impaired flexibility, postural dysfunction, and pain.   REHAB POTENTIAL: Good  CLINICAL DECISION MAKING: Stable/uncomplicated  EVALUATION COMPLEXITY: Low   GOALS: Goals reviewed with patient? Yes  SHORT TERM GOALS: Target date: 08/01/22 Independent with initial HEP Goal status: still not doing 09/11/22, doing the stretches 09/24/22    LONG TERM GOALS: Target date: 10/10/22  Independent with advanced HEP Goal status: ongoing 10/09/22  2.  Perform stand pivot transfer independently Goal status:ongoing 08/28/22, MET 09/24/22  3.  Maintain knee ROM to within 25 degrees of full extension Goal status: progressing 09/18/22  4.  Independent with bed mobility and transfers Goal status: progressing CGA 09/18/22  5.  Do a  sliding board transfer into the car Goal status: ongoing 08/28/22, ongoing 09/24/22  PLAN:  PT FREQUENCY: 1-2x/week  PT DURATION: 12 weeks  PLANNED INTERVENTIONS: Therapeutic exercises, Therapeutic activity, Neuromuscular re-education, Balance training, Gait training, Patient/Family education, Self Care, Joint mobilization, Dry Needling, Electrical stimulation, and Manual therapy  PLAN FOR NEXT SESSION: really work on standing, she really cannot activate the quads, work on passive stretch, she changed insurances and we will have to request visits   Jearld Lesch, PT 10/09/2022, 4:14 PM

## 2022-10-22 ENCOUNTER — Ambulatory Visit: Payer: MEDICAID | Admitting: Physical Therapy

## 2022-10-22 ENCOUNTER — Encounter: Payer: Self-pay | Admitting: Physical Therapy

## 2022-10-22 DIAGNOSIS — R252 Cramp and spasm: Secondary | ICD-10-CM

## 2022-10-22 DIAGNOSIS — M62830 Muscle spasm of back: Secondary | ICD-10-CM

## 2022-10-22 DIAGNOSIS — R531 Weakness: Secondary | ICD-10-CM

## 2022-10-22 NOTE — Therapy (Signed)
OUTPATIENT PHYSICAL THERAPY LOWER EXTREMITY TREATMENT    Patient Name: Melissa Wagner MRN: [id number] DOB:06-16-1995, 27 y.o., female Today's Date: 10/22/2022  END OF SESSION:  PT End of Session - 10/22/22 1603     Visit Number 12    Date for PT Re-Evaluation 10/10/22    PT Start Time 1603    PT Stop Time 1645    PT Time Calculation (min) 42 min    Activity Tolerance Patient tolerated treatment well    Behavior During Therapy WFL for tasks assessed/performed               Past Medical History:  Diagnosis Date   Anxiety    Cerebral palsy (HCC)    Dysmenorrhea    Past Surgical History:  Procedure Laterality Date   ADDUCTOR RELEASE     at age 43   dorsal rhizotomy     at age 9   HAMSTRING LENGTHENING     at the age 89   Patient Active Problem List   Diagnosis Date Noted   Piriformis syndrome of left side 08/23/2022   Anxiety 07/22/2022   Contracture of both hamstrings 01/24/2022   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/24/2022   Chronic migraine w/o aura w/o status migrainosus, not intractable 11/15/2021   Spastic diplegic cerebral palsy (HCC) 11/15/2021   Chronic migraine without aura without status migrainosus, not intractable 08/06/2021   Spasticity 10/11/2019   Jaw pain 07/07/2019   Wheelchair dependence 06/18/2019   Diplegic cerebral palsy (HCC) 06/18/2019   Myofascial pain 06/18/2019   Pain of right scapula 06/18/2019    PCP: Genice Rouge, MD  REFERRING PROVIDER: Genice Rouge, MD  REFERRING DIAG: s/p HS, adductor and iliopsoas resection due to contractures from CP  THERAPY DIAG:  Weakness generalized  Spasticity  Muscle spasm of back  Rationale for Evaluation and Treatment: Rehabilitation  ONSET DATE: 05/22/22  SUBJECTIVE:   SUBJECTIVE STATEMENT: "Today had been kind of a rough day, I'm not going to lie" Over slept, she can't find her glasses, headache    PERTINENT HISTORY: Patient is a 27 yr old R handed female with CP- spastic diplegia  and has generalized anxiety disorder- On Sertraline for anxiety. Also has myofascial pain Here for f/u on diplegic CP and also here for TrP injections for pain control.  PAIN:  Are you having pain? Yes right upper trap, neck and rhomboid area, has had theis pain prior to surgery, gets trigger point injections, she has hip painwith sitting and with lying on the left side, pain in the hips and in the lower legs  PRECAUTIONS: None  WEIGHT BEARING RESTRICTIONS: No  FALLS:  Has patient fallen in last 6 months? Yes. Number of falls 1  LIVING ENVIRONMENT: Lives with: lives with their family Lives in: House/apartment Stairs: No Has following equipment at home: Wheelchair (power)  OCCUPATION: graduated with masters degree in SW  PLOF:  prior to surgery she was independent with toilet transfers, reports cannot do this now requires max A now.  She reports independent with bed tranfers and mobility, needs max A now    PATIENT GOALS: toilet transfer on own, keep LE mms stretches  NEXT MD VISIT: next week  OBJECTIVE:    COGNITION: Overall cognitive status: Within functional limits for tasks assessed     SENSATION: WFL   MUSCLE LENGTH: Tight HS, hip flexor, ankles and adductors are tight  PALPATION: Very tight and tender in the neck, upper traps and the rhomboids, she is tender  in the surgical areas as well  LOWER EXTREMITY ROM:  Passive ROM Right eval Left eval R/L 08/22/22 R/L 10/09/22  Hip flexion      Hip extension Cannot get to neutral " 5/5   Hip abduction 10 10  25/20  Hip adduction      Hip internal rotation      Hip external rotation      Knee flexion      Knee extension 25 30 20/25 18/13  Ankle dorsiflexion 0 0    Ankle plantarflexion      Ankle inversion      Ankle eversion       (Blank rows = not tested)  LOWER EXTREMITY MMT:  UE strength 4+/5  MMT Right eval Left eval  Hip flexion 3 3  Hip extension    Hip abduction    Hip adduction    Hip  internal rotation    Hip external rotation    Knee flexion    Knee extension 2 2  Ankle dorsiflexion 2 3  Ankle plantarflexion    Ankle inversion    Ankle eversion     (Blank rows = not tested) GAIT: Distance walked: unable  TRANSFERS:  Sit to stand Max A Standing Max A with a walker Car transfer is Max A where her mom lifts her from the chair and puts her in the car. When standing her knees are not strong enough to hold her and flex, she also flexes her trunk, needs knees blocked and then help with pressure on her buttocks to get upright  TODAY'S TREATMENT:                                                                                                                              DATE:   10/22/22 NuStep L3 x 6 min Standing with her holding PT shoulders on 6" step min assist at times, I would release pt hips so she could bear full weight 4x15'' Passive stretch of the LE's Heel slides x 5 each alternating Red tband clamshells Bridges Passive stretch of the LE's  10/08/22 Nustep Level 5 x 6 minutes While she was doing the [employer] I worked on the chair, she bought the chair out of pocket on [employer] and it does not have lateral trunk supports, she bought these off of [employer] but her mom could not figure them out, I was able to put them on but due to the narrowness of the chair only one would work and she wanted it on the right side to help with support Passive stretch of the LE's Standing with her holding PT shoulders on 6" step, PT blocking knees and providing hip extension to neutral and having her use her trunk for posture, 2 minutes x 2 Red tband clamshells bridges  09/24/22 NuStep L5 x80mins  Reviewed goals  SAQ with help x10 each side  Bridges 2x10  Red tband clamshells 2x10 Green band hip ext in  supine 2x10 In chair cable extension and rows 5# 2x10   09/18/22 She was in the mechanized chair today and we used it for standing x 10 minutes total with PT facilitating some increase  of knee extension, hip extension and then PT doing some Pectoral stretches from behind her  Transferred with set up and CGA to the mat table PROM to the LE's, knees, ankles, hips(adductors and rotation) Sliding board active hip abduction, some hip ER/IR actively Bridges Red tband clamshells Green tband hip extension in supine Gentle STM to the right upper trap and rhomboid MHP/IFC to this area in sitting at end of treatment  09/11/22 Nustep level 3 x 6 minutes Passive stretch of the HS, adductors and hip flexors STM to the right upper trap, necka nd rhomboids Standing with ball b/n knees, knees blocked and PT hands on hips working on weight bearing and knee extension and back extensio Transfers with Min A mostly set up, I did try to fix the brakes on the W/C but it did not work as it seems the tread on the tire is worn out  09/04/22 Min A transfers Nustep level 5 x 5 mintues Standing with 6" step, 2 persone one in front with hand on buttocks to bring hips forward and the other behind with hands on knees to get extension 3 x 1 minute ball b/n knees Supine passive stretch of the HS, hip flexor, adductors, calves  Henreitta Leber SAQ with assist some eccentrics Did bathroom standing using the bar Talked about chair safety as her brakes are not working  08/28/22 Transfer to mat table in OT room Min A from the smaller manual chair Passive stretch to the LE's in supine Bridges Partial sit ups with assist Standing with ball b/n knees, PT blocking knees from forward PT hands on the back side and then verbal cues for hips forward and to get tall and chest up and out, stood about 1 minute x4, also did partial sit to stands and hips forward and back and knees [employer] with assist  PATIENT EDUCATION:  Education details: POC/HEP Person educated: Patient and Parent Education method: Explanation, Demonstration, Actor cues, Verbal cues, and Handouts Education comprehension: verbalized  understanding  HOME EXERCISE PROGRAM: Access Code: [email or web address] URL: [email or web address] Date: 07/11/2022 Prepared by: Stacie Glaze  Exercises - Seated Hamstring Stretch with Chair  - 1 x daily - 7 x weekly - 2 sets - 10 reps - 30 hold - Supine Hip Adductor Stretch  - 1 x daily - 7 x weekly - 2 sets - 10 reps - 30 hold  ASSESSMENT:  CLINICAL IMPRESSION:  Continued to work on standing with Her hands on therapist shoulders. Cue needed to extend knees as much as possible. Pt is very tight in the HS noted with passive stretching. Standing and heel slides did cause some increase fatigue. Pt needed LE braced to complete bridges. No pain reported during session.  OBJECTIVE IMPAIRMENTS: Abnormal gait, cardiopulmonary status limiting activity, decreased activity tolerance, decreased balance, decreased coordination, decreased endurance, decreased mobility, difficulty walking, decreased ROM, decreased strength, increased fascial restrictions, increased muscle spasms, impaired flexibility, postural dysfunction, and pain.   REHAB POTENTIAL: Good  CLINICAL DECISION MAKING: Stable/uncomplicated  EVALUATION COMPLEXITY: Low   GOALS: Goals reviewed with patient? Yes  SHORT TERM GOALS: Target date: 08/01/22 Independent with initial HEP Goal status: still not doing 09/11/22, doing the stretches 09/24/22    LONG TERM GOALS: Target date: 10/10/22  Independent with advanced HEP Goal status: ongoing 10/09/22  2.  Perform stand pivot transfer independently Goal status:ongoing 08/28/22, MET 09/24/22  3.  Maintain knee ROM to within 25 degrees of full extension Goal status: progressing 09/18/22  4.  Independent with bed mobility and transfers Goal status: progressing CGA 09/18/22  5.  Do a sliding board transfer into the car Goal status: ongoing 08/28/22, ongoing 09/24/22  PLAN:  PT FREQUENCY: 1-2x/week  PT DURATION: 12 weeks  PLANNED INTERVENTIONS: Therapeutic exercises,  Therapeutic activity, Neuromuscular re-education, Balance training, Gait training, Patient/Family education, Self Care, Joint mobilization, Dry Needling, Electrical stimulation, and Manual therapy  PLAN FOR NEXT SESSION: really work on standing, she really cannot activate the quads, work on passive stretch, she changed insurances and we will have to request visits   Grayce Sessions, PTA 10/22/2022, 4:04 PM

## 2022-10-29 ENCOUNTER — Ambulatory Visit: Payer: MEDICAID | Admitting: Physical Therapy

## 2022-11-05 ENCOUNTER — Ambulatory Visit: Payer: MEDICAID | Attending: Orthopedic Surgery | Admitting: Physical Therapy

## 2022-11-05 ENCOUNTER — Encounter: Payer: Self-pay | Admitting: Physical Therapy

## 2022-11-05 DIAGNOSIS — M62830 Muscle spasm of back: Secondary | ICD-10-CM | POA: Diagnosis present

## 2022-11-05 DIAGNOSIS — R252 Cramp and spasm: Secondary | ICD-10-CM | POA: Diagnosis present

## 2022-11-05 DIAGNOSIS — R531 Weakness: Secondary | ICD-10-CM | POA: Insufficient documentation

## 2022-11-05 NOTE — Therapy (Signed)
OUTPATIENT PHYSICAL THERAPY LOWER EXTREMITY TREATMENT    Patient Name: Melissa Wagner MRN: [id number] DOB:Jan 24, 1996, 27 y.o., female Today's Date: 11/05/2022  END OF SESSION:  PT End of Session - 11/05/22 1518     Visit Number 13    Date for PT Re-Evaluation 12/30/22    Authorization Type [employer] 3/14    PT Start Time 1519    PT Stop Time 1606    PT Time Calculation (min) 47 min    Activity Tolerance Patient tolerated treatment well    Behavior During Therapy WFL for tasks assessed/performed               Past Medical History:  Diagnosis Date   Anxiety    Cerebral palsy (HCC)    Dysmenorrhea    Past Surgical History:  Procedure Laterality Date   ADDUCTOR RELEASE     at age 6   dorsal rhizotomy     at age 52   HAMSTRING LENGTHENING     at the age 32   Patient Active Problem List   Diagnosis Date Noted   Piriformis syndrome of left side 08/23/2022   Anxiety 07/22/2022   Contracture of both hamstrings 01/24/2022   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/24/2022   Chronic migraine w/o aura w/o status migrainosus, not intractable 11/15/2021   Spastic diplegic cerebral palsy (HCC) 11/15/2021   Chronic migraine without aura without status migrainosus, not intractable 08/06/2021   Spasticity 10/11/2019   Jaw pain 07/07/2019   Wheelchair dependence 06/18/2019   Diplegic cerebral palsy (HCC) 06/18/2019   Myofascial pain 06/18/2019   Pain of right scapula 06/18/2019    PCP: Genice Rouge, MD  REFERRING PROVIDER: Genice Rouge, MD  REFERRING DIAG: s/p HS, adductor and iliopsoas resection due to contractures from CP  THERAPY DIAG:  Weakness generalized  Spasticity  Muscle spasm of back  Rationale for Evaluation and Treatment: Rehabilitation  ONSET DATE: 05/22/22  SUBJECTIVE:   SUBJECTIVE STATEMENT: Patient reports that she really needs to stretch today, has not been stretching, she reports that the right shoulder is really stiff and tight and sore, rates  pain a 5/10   PERTINENT HISTORY: Patient is a 27 yr old R handed female with CP- spastic diplegia and has generalized anxiety disorder- On Sertraline for anxiety. Also has myofascial pain Here for f/u on diplegic CP and also here for TrP injections for pain control.  PAIN:  Are you having pain? Yes right upper trap, neck and rhomboid area, has had theis pain prior to surgery, gets trigger point injections, she has hip painwith sitting and with lying on the left side, pain in the hips and in the lower legs  PRECAUTIONS: None  WEIGHT BEARING RESTRICTIONS: No  FALLS:  Has patient fallen in last 6 months? Yes. Number of falls 1  LIVING ENVIRONMENT: Lives with: lives with their family Lives in: House/apartment Stairs: No Has following equipment at home: Wheelchair (power)  OCCUPATION: graduated with masters degree in SW  PLOF:  prior to surgery she was independent with toilet transfers, reports cannot do this now requires max A now.  She reports independent with bed tranfers and mobility, needs max A now    PATIENT GOALS: toilet transfer on own, keep LE mms stretches  NEXT MD VISIT: next week  OBJECTIVE:    COGNITION: Overall cognitive status: Within functional limits for tasks assessed     SENSATION: WFL   MUSCLE LENGTH: Tight HS, hip flexor, ankles and adductors are tight  PALPATION:  Very tight and tender in the neck, upper traps and the rhomboids, she is tender in the surgical areas as well  LOWER EXTREMITY ROM:  Passive ROM Right eval Left eval R/L 08/22/22 R/L 10/09/22  Hip flexion      Hip extension Cannot get to neutral " 5/5   Hip abduction 10 10  25/20  Hip adduction      Hip internal rotation      Hip external rotation      Knee flexion      Knee extension 25 30 20/25 18/13  Ankle dorsiflexion 0 0    Ankle plantarflexion      Ankle inversion      Ankle eversion       (Blank rows = not tested)  LOWER EXTREMITY MMT:  UE strength 4+/5  MMT  Right eval Left eval  Hip flexion 3 3  Hip extension    Hip abduction    Hip adduction    Hip internal rotation    Hip external rotation    Knee flexion    Knee extension 2 2  Ankle dorsiflexion 2 3  Ankle plantarflexion    Ankle inversion    Ankle eversion     (Blank rows = not tested) GAIT: Distance walked: unable  TRANSFERS:  Sit to stand Max A Standing Max A with a walker Car transfer is Max A where her mom lifts her from the chair and puts her in the car. When standing her knees are not strong enough to hold her and flex, she also flexes her trunk, needs knees blocked and then help with pressure on her buttocks to get upright  TODAY'S TREATMENT:                                                                                                                              DATE:   11/05/22 Nustep level 4 x 6 minutes Passive right shoulder ROM Passive stretch to the LE's Standing working on bearing weight on OT mat and a 6" step W/c mobility manual w/c x 100 feet took 65 seconds flat smooth surface Bridges Standing butt out and then tucks to work on mms in the legs and buttocks  10/22/22 NuStep L3 x 6 min Standing with her holding PT shoulders on 6" step min assist at times, I would release pt hips so she could bear full weight 4x15'' Passive stretch of the LE's Heel slides x 5 each alternating Red tband clamshells Bridges Passive stretch of the LE's  10/08/22 Nustep Level 5 x 6 minutes While she was doing the [employer] I worked on the chair, she bought the chair out of pocket on [employer] and it does not have lateral trunk supports, she bought these off of [employer] but her mom could not figure them out, I was able to put them on but due to the narrowness of the chair only one would work and she wanted it on the  right side to help with support Passive stretch of the LE's Standing with her holding PT shoulders on 6" step, PT blocking knees and providing hip extension to neutral and  having her use her trunk for posture, 2 minutes x 2 Red tband clamshells bridges  09/24/22 NuStep L5 x46mins  Reviewed goals  SAQ with help x10 each side  Bridges 2x10  Red tband clamshells 2x10 Green band hip ext in supine 2x10 In chair cable extension and rows 5# 2x10   09/18/22 She was in the mechanized chair today and we used it for standing x 10 minutes total with PT facilitating some increase of knee extension, hip extension and then PT doing some Pectoral stretches from behind her  Transferred with set up and CGA to the mat table PROM to the LE's, knees, ankles, hips(adductors and rotation) Sliding board active hip abduction, some hip ER/IR actively Bridges Red tband clamshells Green tband hip extension in supine Gentle STM to the right upper trap and rhomboid MHP/IFC to this area in sitting at end of treatment  09/11/22 Nustep level 3 x 6 minutes Passive stretch of the HS, adductors and hip flexors STM to the right upper trap, necka nd rhomboids Standing with ball b/n knees, knees blocked and PT hands on hips working on weight bearing and knee extension and back extensio Transfers with Min A mostly set up, I did try to fix the brakes on the W/C but it did not work as it seems the tread on the tire is worn out  09/04/22 Min A transfers Nustep level 5 x 5 mintues Standing with 6" step, 2 persone one in front with hand on buttocks to bring hips forward and the other behind with hands on knees to get extension 3 x 1 minute ball b/n knees Supine passive stretch of the HS, hip flexor, adductors, calves  Henreitta Leber SAQ with assist some eccentrics Did bathroom standing using the bar Talked about chair safety as her brakes are not working  08/28/22 Transfer to mat table in OT room Min A from the smaller manual chair Passive stretch to the LE's in supine Bridges Partial sit ups with assist Standing with ball b/n knees, PT blocking knees from forward PT hands on the back side and  then verbal cues for hips forward and to get tall and chest up and out, stood about 1 minute x4, also did partial sit to stands and hips forward and back and knees [employer] with assist  PATIENT EDUCATION:  Education details: POC/HEP Person educated: Patient and Parent Education method: Explanation, Demonstration, Actor cues, Verbal cues, and Handouts Education comprehension: verbalized understanding  HOME EXERCISE PROGRAM: Access Code: [email or web address] URL: [email or web address] Date: 07/11/2022 Prepared by: Stacie Glaze  Exercises - Seated Hamstring Stretch with Chair  - 1 x daily - 7 x weekly - 2 sets - 10 reps - 30 hold - Supine Hip Adductor Stretch  - 1 x daily - 7 x weekly - 2 sets - 10 reps - 30 hold  ASSESSMENT:  CLINICAL IMPRESSION:  Patient reports that she is really getting back to normal, can get in and out of bed independently now, still max A for car transfer, reports that she does have trouble with manual w/c mobility, I got a baseline of this today, she did fatigue with this in the shoulders and the arms. OBJECTIVE IMPAIRMENTS: Abnormal gait, cardiopulmonary status limiting activity, decreased activity tolerance, decreased balance, decreased coordination, decreased endurance, decreased mobility, difficulty walking, decreased ROM, decreased strength,  increased fascial restrictions, increased muscle spasms, impaired flexibility, postural dysfunction, and pain.   REHAB POTENTIAL: Good  CLINICAL DECISION MAKING: Stable/uncomplicated  EVALUATION COMPLEXITY: Low   GOALS: Goals reviewed with patient? Yes  SHORT TERM GOALS: Target date: 08/01/22 Independent with initial HEP Goal status: still not doing 09/11/22, doing the stretches 09/24/22    LONG TERM GOALS: Target date: 10/10/22  Independent with advanced HEP Goal status: ongoing 10/09/22  2.  Perform stand pivot transfer independently Goal status:ongoing 08/28/22, MET 09/24/22  3.  Maintain knee ROM to  within 25 degrees of full extension Goal status: progressing 11/05/22  4.  Independent with bed mobility and transfers Goal status: progressing SBA 11/05/22  5.  Do a sliding board transfer into the car Goal status: ongoing 08/28/22, ongoing 09/24/22  PLAN:  PT FREQUENCY: 1-2x/week  PT DURATION: 12 weeks  PLANNED INTERVENTIONS: Therapeutic exercises, Therapeutic activity, Neuromuscular re-education, Balance training, Gait training, Patient/Family education, Self Care, Joint mobilization, Dry Needling, Electrical stimulation, and Manual therapy  PLAN FOR NEXT SESSION: really work on standing, she really cannot activate the quads, work on passive stretch, she changed insurances and we will have to request visits  try w/c mobility   Alilah Mcmeans W, PT 11/05/2022, 3:19 PM

## 2022-11-12 ENCOUNTER — Encounter: Payer: Self-pay | Admitting: Physical Therapy

## 2022-11-12 ENCOUNTER — Ambulatory Visit: Payer: MEDICAID | Admitting: Physical Therapy

## 2022-11-12 DIAGNOSIS — R531 Weakness: Secondary | ICD-10-CM | POA: Diagnosis not present

## 2022-11-12 DIAGNOSIS — M62830 Muscle spasm of back: Secondary | ICD-10-CM

## 2022-11-12 DIAGNOSIS — R252 Cramp and spasm: Secondary | ICD-10-CM

## 2022-11-12 NOTE — Therapy (Signed)
OUTPATIENT PHYSICAL THERAPY LOWER EXTREMITY TREATMENT    Patient Name: Melissa Wagner MRN: [id number] DOB:1996/03/21, 27 y.o., female Today's Date: 11/12/2022  END OF SESSION:  PT End of Session - 11/12/22 1521     Visit Number 14    Date for PT Re-Evaluation 12/30/22    Authorization Type [employer] 4/14    PT Start Time 1520    PT Stop Time 1610    PT Time Calculation (min) 50 min    Activity Tolerance Patient tolerated treatment well    Behavior During Therapy WFL for tasks assessed/performed               Past Medical History:  Diagnosis Date   Anxiety    Cerebral palsy (HCC)    Dysmenorrhea    Past Surgical History:  Procedure Laterality Date   ADDUCTOR RELEASE     at age 51   dorsal rhizotomy     at age 65   HAMSTRING LENGTHENING     at the age 66   Patient Active Problem List   Diagnosis Date Noted   Piriformis syndrome of left side 08/23/2022   Anxiety 07/22/2022   Contracture of both hamstrings 01/24/2022   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/24/2022   Chronic migraine w/o aura w/o status migrainosus, not intractable 11/15/2021   Spastic diplegic cerebral palsy (HCC) 11/15/2021   Chronic migraine without aura without status migrainosus, not intractable 08/06/2021   Spasticity 10/11/2019   Jaw pain 07/07/2019   Wheelchair dependence 06/18/2019   Diplegic cerebral palsy (HCC) 06/18/2019   Myofascial pain 06/18/2019   Pain of right scapula 06/18/2019    PCP: Genice Rouge, MD  REFERRING PROVIDER: Genice Rouge, MD  REFERRING DIAG: s/p HS, adductor and iliopsoas resection due to contractures from CP  THERAPY DIAG:  Weakness generalized  Spasticity  Muscle spasm of back  Rationale for Evaluation and Treatment: Rehabilitation  ONSET DATE: 05/22/22  SUBJECTIVE:   SUBJECTIVE STATEMENT: Patient doing well, she reports that she was sore in the right calf after last week, a little sore in the neck and upper back   PERTINENT HISTORY: Patient is  a 27 yr old R handed female with CP- spastic diplegia and has generalized anxiety disorder- On Sertraline for anxiety. Also has myofascial pain Here for f/u on diplegic CP and also here for TrP injections for pain control.  PAIN:  Are you having pain? Yes right upper trap, neck and rhomboid area, has had theis pain prior to surgery, gets trigger point injections, she has hip painwith sitting and with lying on the left side, pain in the hips and in the lower legs  PRECAUTIONS: None  WEIGHT BEARING RESTRICTIONS: No  FALLS:  Has patient fallen in last 6 months? Yes. Number of falls 1  LIVING ENVIRONMENT: Lives with: lives with their family Lives in: House/apartment Stairs: No Has following equipment at home: Wheelchair (power)  OCCUPATION: graduated with masters degree in SW  PLOF:  prior to surgery she was independent with toilet transfers, reports cannot do this now requires max A now.  She reports independent with bed tranfers and mobility, needs max A now    PATIENT GOALS: toilet transfer on own, keep LE mms stretches  NEXT MD VISIT: next week  OBJECTIVE:    COGNITION: Overall cognitive status: Within functional limits for tasks assessed     SENSATION: WFL   MUSCLE LENGTH: Tight HS, hip flexor, ankles and adductors are tight  PALPATION: Very tight and tender in  the neck, upper traps and the rhomboids, she is tender in the surgical areas as well  LOWER EXTREMITY ROM:  Passive ROM Right eval Left eval R/L 08/22/22 R/L 10/09/22  Hip flexion      Hip extension Cannot get to neutral " 5/5   Hip abduction 10 10  25/20  Hip adduction      Hip internal rotation      Hip external rotation      Knee flexion      Knee extension 25 30 20/25 18/13  Ankle dorsiflexion 0 0    Ankle plantarflexion      Ankle inversion      Ankle eversion       (Blank rows = not tested)  LOWER EXTREMITY MMT:  UE strength 4+/5  MMT Right eval Left eval  Hip flexion 3 3  Hip  extension    Hip abduction    Hip adduction    Hip internal rotation    Hip external rotation    Knee flexion    Knee extension 2 2  Ankle dorsiflexion 2 3  Ankle plantarflexion    Ankle inversion    Ankle eversion     (Blank rows = not tested) GAIT: Distance walked: unable  TRANSFERS:  Sit to stand Max A Standing Max A with a walker Car transfer is Max A where her mom lifts her from the chair and puts her in the car. When standing her knees are not strong enough to hold her and flex, she also flexes her trunk, needs knees blocked and then help with pressure on her buttocks to get upright  TODAY'S TREATMENT:                                                                                                                              DATE:   11/12/22 Nustep level 4 x 6 minutes Passive right shoulder ROM Passive stretch to the LE's STM to the neck and upper back Standing with ball b/n knees and blocking knees 4x2 minutes Some small sit to stand motions trying to get her to engage the glutes and the quads SAQ with assist again trying to get quads to work STM to the right calf area  11/05/22 Nustep level 4 x 6 minutes Passive right shoulder ROM Passive stretch to the LE's Standing working on bearing weight on OT mat and a 6" step W/c mobility manual w/c x 100 feet took 65 seconds flat smooth surface Bridges Standing butt out and then tucks to work on mms in the legs and buttocks  10/22/22 NuStep L3 x 6 min Standing with her holding PT shoulders on 6" step min assist at times, I would release pt hips so she could bear full weight 4x15'' Passive stretch of the LE's Heel slides x 5 each alternating Red tband clamshells Bridges Passive stretch of the LE's  10/08/22 Nustep Level 5 x 6 minutes While she was doing the  Nustep I worked on the chair, she bought the chair out of pocket on [employer] and it does not have lateral trunk supports, she bought these off of [employer] but her mom could  not figure them out, I was able to put them on but due to the narrowness of the chair only one would work and she wanted it on the right side to help with support Passive stretch of the LE's Standing with her holding PT shoulders on 6" step, PT blocking knees and providing hip extension to neutral and having her use her trunk for posture, 2 minutes x 2 Red tband clamshells bridges  09/24/22 NuStep L5 x107mins  Reviewed goals  SAQ with help x10 each side  Bridges 2x10  Red tband clamshells 2x10 Green band hip ext in supine 2x10 In chair cable extension and rows 5# 2x10   09/18/22 She was in the mechanized chair today and we used it for standing x 10 minutes total with PT facilitating some increase of knee extension, hip extension and then PT doing some Pectoral stretches from behind her  Transferred with set up and CGA to the mat table PROM to the LE's, knees, ankles, hips(adductors and rotation) Sliding board active hip abduction, some hip ER/IR actively Bridges Red tband clamshells Green tband hip extension in supine Gentle STM to the right upper trap and rhomboid MHP/IFC to this area in sitting at end of treatment  09/11/22 Nustep level 3 x 6 minutes Passive stretch of the HS, adductors and hip flexors STM to the right upper trap, necka nd rhomboids Standing with ball b/n knees, knees blocked and PT hands on hips working on weight bearing and knee extension and back extensio Transfers with Min A mostly set up, I did try to fix the brakes on the W/C but it did not work as it seems the tread on the tire is worn out  PATIENT EDUCATION:  Education details: POC/HEP Person educated: Patient and Parent Education method: Explanation, Facilities manager, Actor cues, Verbal cues, and Handouts Education comprehension: verbalized understanding  HOME EXERCISE PROGRAM: Access Code: [email or web address] URL: [email or web address] Date: 07/11/2022 Prepared by: Stacie Glaze  Exercises -  Seated Hamstring Stretch with Chair  - 1 x daily - 7 x weekly - 2 sets - 10 reps - 30 hold - Supine Hip Adductor Stretch  - 1 x daily - 7 x weekly - 2 sets - 10 reps - 30 hold  ASSESSMENT:  CLINICAL IMPRESSION:  Patient has had some calf pain since the last visit when we did some increased standing.  I feel that we are getting some good glute and some quad activation with standing and the partial squats.  I cannot really get her to get much active motion of the quads in sitting or supine, I do feel that the ROM is improving in the knees and the hips. OBJECTIVE IMPAIRMENTS: Abnormal gait, cardiopulmonary status limiting activity, decreased activity tolerance, decreased balance, decreased coordination, decreased endurance, decreased mobility, difficulty walking, decreased ROM, decreased strength, increased fascial restrictions, increased muscle spasms, impaired flexibility, postural dysfunction, and pain.   REHAB POTENTIAL: Good  CLINICAL DECISION MAKING: Stable/uncomplicated  EVALUATION COMPLEXITY: Low   GOALS: Goals reviewed with patient? Yes  SHORT TERM GOALS: Target date: 08/01/22 Independent with initial HEP Goal status: still not doing 09/11/22, doing the stretches 09/24/22    LONG TERM GOALS: Target date: 10/10/22  Independent with advanced HEP Goal status: ongoing 10/09/22  2.  Perform stand pivot transfer independently Goal status:ongoing 08/28/22, MET  09/24/22  3.  Maintain knee ROM to within 25 degrees of full extension Goal status: progressing 11/05/22  4.  Independent with bed mobility and transfers Goal status: progressing SBA 11/05/22  5.  Do a sliding board transfer into the car Goal status: ongoing 08/28/22, ongoing 09/24/22  PLAN:  PT FREQUENCY: 1-2x/week  PT DURATION: 12 weeks  PLANNED INTERVENTIONS: Therapeutic exercises, Therapeutic activity, Neuromuscular re-education, Balance training, Gait training, Patient/Family education, Self Care, Joint mobilization, Dry  Needling, Electrical stimulation, and Manual therapy  PLAN FOR NEXT SESSION: really work on standing, she really cannot activate the quads, work on passive stretch, she changed insurances and we will have to request visits  try w/c mobility   Rosemae Mcquown W, PT 11/12/2022, 3:22 PM

## 2022-11-15 ENCOUNTER — Encounter: Payer: MEDICAID | Attending: Physical Medicine and Rehabilitation | Admitting: Physical Medicine and Rehabilitation

## 2022-11-15 ENCOUNTER — Encounter: Payer: Self-pay | Admitting: Physical Medicine and Rehabilitation

## 2022-11-15 VITALS — BP 131/81 | HR 96

## 2022-11-15 DIAGNOSIS — G801 Spastic diplegic cerebral palsy: Secondary | ICD-10-CM | POA: Insufficient documentation

## 2022-11-15 DIAGNOSIS — G43709 Chronic migraine without aura, not intractable, without status migrainosus: Secondary | ICD-10-CM | POA: Insufficient documentation

## 2022-11-15 DIAGNOSIS — M7918 Myalgia, other site: Secondary | ICD-10-CM | POA: Diagnosis present

## 2022-11-15 DIAGNOSIS — R252 Cramp and spasm: Secondary | ICD-10-CM | POA: Insufficient documentation

## 2022-11-15 MED ORDER — LIDOCAINE HCL 1 % IJ SOLN
9.0000 mL | Freq: Once | INTRAMUSCULAR | Status: AC
Start: 2022-11-15 — End: 2022-11-15
  Administered 2022-11-15: 9 mL

## 2022-11-15 NOTE — Patient Instructions (Signed)
Plan: Wait on Botox for gastrocs for now- pt wants to wait.    2. Take a picture of yourself in manual w/c.    3. See if can find welder to weld lateral pads on w/c. Via Psych[employer], occupational.  Keep in touch with w/c company to get more lateral pads.   4. Trigger point injections-  Patient here for trigger point injections for  Consent done and on chart.  Cleaned areas with alcohol and injected using a 27 gauge 1.5 inch needle  Injected 4.5cc - 1.5 wasted Using 1% Lidocaine with no EPI  Upper traps B/L  Levators B/L  Posterior scalenes Middle scalenes- B/L  Splenius Capitus Pectoralis Major- B/L  Rhomboids- 4 x on R 1x on L  Infraspinatus Teres Major/minor Thoracic paraspinals Lumbar paraspinals Other injections-    Patient's level of pain prior was- 4/10 if doesn't move much Current level of pain after injections is- relaxed dramatically- can move better but pain about the same  There was no bleeding or complications.  Patient was advised to drink a lot of water on day after injections to flush system Will have increased soreness for 12-48 hours after injections.  Can use Lidocaine patches the day AFTER injections Can use theracane on day of injections in places didn't inject Can use heating pad 4-6 hours AFTER injections   5. Con't Dantrolene- doesn't need refills  6. Con't Duloxetine for migraine prophylaxis - to get from dr [place]  7. F/u- in 6 weeks- trigger point injections and f/u on CP

## 2022-11-15 NOTE — Progress Notes (Signed)
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 Also has myofascial pain Here for f/u on diplegic CP and also here for TrP injections for pain control.   Insurance should fix things-  10/04/22- not paying for last trigger point injections.  Now fixed.    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    Orthotics got denied- after the fact-  Not pushing the issue, because don't need them.    Strength in legs back to what is was before surgery.  Can now extend legs 7 degrees more than could before surgery.  Needs more strength in quad- doesn't have ability to do active ROM-   Still doing therapy/PT- outpt- PT- [facility].  Also helping increase stamina to push self.  And to also help with shoulder between trP injections.   Not doing dry needling.  Using TENS unit and massaging weekly and stretches ot open up chest muscles.   Met staff to work with [employer] program.    PT asked about Botox in gastrocs- didn't release gastrocs just hamstrings adductor and hip flexor released.    Manual w/c came in 1 month ago- shoulders stay down when pushes it- not up at ears- more narrow than used to-  Cannot get lateral pads attach to frame- current ones keep falling down-   Exam:  Gastrocs- still tight- R calf worse than L calf- - has >90 degrees DF noted- with gastrocs as extended as possible, but missing ~ 10-15 degrees Plan: Wait on Botox for gastrocs for now- pt wants to wait.    2. Take a picture of yourself in manual w/c.    3. See if can find welder to weld lateral pads on w/c. Via Psych[employer], occupational.  Keep in touch with w/c company to get more lateral pads.   4. Trigger point injections-  Patient here for trigger point injections for  Consent done and on chart.  Cleaned areas with alcohol and  injected using a 27 gauge 1.5 inch needle  Injected 4.5cc - 1.5 wasted Using 1% Lidocaine with no EPI  Upper traps B/L  Levators B/L  Posterior scalenes Middle scalenes- B/L  Splenius Capitus Pectoralis Major- B/L  Rhomboids- 4 x on R 1x on L  Infraspinatus Teres Major/minor Thoracic paraspinals Lumbar paraspinals Other injections-    Patient's level of pain prior was- 4/10 if doesn't move much Current level of pain after injections is- relaxed dramatically- can move better but pain about the same  There was no bleeding or complications.  Patient was advised to drink a lot of water on day after injections to flush system Will have increased soreness for 12-48 hours after injections.  Can use Lidocaine patches the day AFTER injections Can use theracane on day of injections in places didn't inject Can use heating pad 4-6 hours AFTER injections   5. Con't Dantrolene- doesn't need refills  6. Con't Duloxetine for migraine prophylaxis - to get from dr Terrace [place]  7. F/u- in 6 weeks- trigger point injections and f/u on CP     I spent a total of 42   minutes on total care today- >50% coordination of care- due to  10 minutes on injections-r est discussing Botox- lateral pads, manual w/c and fixing chair.

## 2022-11-15 NOTE — Addendum Note (Signed)
Addended by: Silas Sacramento T on: 11/15/2022 03:34 PM   Modules accepted: Orders

## 2022-11-18 ENCOUNTER — Ambulatory Visit: Payer: MEDICAID | Admitting: Physical Therapy

## 2022-11-18 NOTE — Therapy (Signed)
OUTPATIENT PHYSICAL THERAPY LOWER EXTREMITY TREATMENT    Patient Name: Melissa Wagner MRN: [id number] DOB:[birth year], 27 y.o., female Today's Date: 11/19/2022  END OF SESSION:  PT End of Session - 11/19/22 1037     Visit Number 15    Date for PT Re-Evaluation 12/30/22    Authorization Type [employer] 4/14    PT Start Time 1037    PT Stop Time 1120    PT Time Calculation (min) 43 min    Activity Tolerance Patient tolerated treatment well    Behavior During Therapy WFL for tasks assessed/performed                Past Medical History:  Diagnosis Date   Anxiety    Cerebral palsy (HCC)    Dysmenorrhea    Past Surgical History:  Procedure Laterality Date   ADDUCTOR RELEASE     at age 89   dorsal rhizotomy     at age 49   HAMSTRING LENGTHENING     at the age 41   Patient Active Problem List   Diagnosis Date Noted   Piriformis syndrome of left side 08/23/2022   Anxiety 07/22/2022   Contracture of both hamstrings 01/24/2022   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/24/2022   Chronic migraine w/o aura w/o status migrainosus, not intractable 11/15/2021   Spastic diplegic cerebral palsy (HCC) 11/15/2021   Chronic migraine without aura without status migrainosus, not intractable 08/06/2021   Spasticity 10/11/2019   Jaw pain 07/07/2019   Wheelchair dependence 06/18/2019   Diplegic cerebral palsy (HCC) 06/18/2019   Myofascial pain 06/18/2019   Pain of right scapula 06/18/2019    PCP: Genice Rouge, MD  REFERRING PROVIDER: Genice Rouge, MD  REFERRING DIAG: s/p HS, adductor and iliopsoas resection due to contractures from CP  THERAPY DIAG:  Weakness generalized  Spasticity  Muscle spasm of back  Rationale for Evaluation and Treatment: Rehabilitation  ONSET DATE: 05/22/22  SUBJECTIVE:   SUBJECTIVE STATEMENT: Doing well, jammed my ankle in the elevator door this morning.    PERTINENT HISTORY: Patient is a 27 yr old R handed female with CP- spastic diplegia and  has generalized anxiety disorder- On Sertraline for anxiety. Also has myofascial pain Here for f/u on diplegic CP and also here for TrP injections for pain control.  PAIN:  Are you having pain? Yes right upper trap, neck and rhomboid area, has had theis pain prior to surgery, gets trigger point injections, she has hip painwith sitting and with lying on the left side, pain in the hips and in the lower legs  PRECAUTIONS: None  WEIGHT BEARING RESTRICTIONS: No  FALLS:  Has patient fallen in last 6 months? Yes. Number of falls 1  LIVING ENVIRONMENT: Lives with: lives with their family Lives in: House/apartment Stairs: No Has following equipment at home: Wheelchair (power)  OCCUPATION: graduated with masters degree in SW  PLOF:  prior to surgery she was independent with toilet transfers, reports cannot do this now requires max A now.  She reports independent with bed tranfers and mobility, needs max A now    PATIENT GOALS: toilet transfer on own, keep LE mms stretches  NEXT MD VISIT: next week  OBJECTIVE:    COGNITION: Overall cognitive status: Within functional limits for tasks assessed     SENSATION: WFL   MUSCLE LENGTH: Tight HS, hip flexor, ankles and adductors are tight  PALPATION: Very tight and tender in the neck, upper traps and the rhomboids, she is tender in the  surgical areas as well  LOWER EXTREMITY ROM:  Passive ROM Right eval Left eval R/L 08/22/22 R/L 10/09/22  Hip flexion      Hip extension Cannot get to neutral " 5/5   Hip abduction 10 10  25/20  Hip adduction      Hip internal rotation      Hip external rotation      Knee flexion      Knee extension 25 30 20/25 18/13  Ankle dorsiflexion 0 0    Ankle plantarflexion      Ankle inversion      Ankle eversion       (Blank rows = not tested)  LOWER EXTREMITY MMT:  UE strength 4+/5  MMT Right eval Left eval  Hip flexion 3 3  Hip extension    Hip abduction    Hip adduction    Hip internal  rotation    Hip external rotation    Knee flexion    Knee extension 2 2  Ankle dorsiflexion 2 3  Ankle plantarflexion    Ankle inversion    Ankle eversion     (Blank rows = not tested) GAIT: Distance walked: unable  TRANSFERS:  Sit to stand Max A Standing Max A with a walker Car transfer is Max A where her mom lifts her from the chair and puts her in the car. When standing her knees are not strong enough to hold her and flex, she also flexes her trunk, needs knees blocked and then help with pressure on her buttocks to get upright  TODAY'S TREATMENT:                                                                                                                              DATE:   11/19/22 NuStep L5x56mins  Passive LE stretching  Heel slides and abd slides in supine 2x10 Supine clamshells yellow 2x10 Small STS from table to 6" step and working on Wbing 3x 20s holds    11/12/22 Nustep level 4 x 6 minutes Passive right shoulder ROM Passive stretch to the LE's STM to the neck and upper back Standing with ball b/n knees and blocking knees 4x2 minutes Some small sit to stand motions trying to get her to engage the glutes and the quads SAQ with assist again trying to get quads to work STM to the right calf area  11/05/22 Nustep level 4 x 6 minutes Passive right shoulder ROM Passive stretch to the LE's Standing working on bearing weight on OT mat and a 6" step W/c mobility manual w/c x 100 feet took 65 seconds flat smooth surface Bridges Standing butt out and then tucks to work on mms in the legs and buttocks  10/22/22 NuStep L3 x 6 min Standing with her holding PT shoulders on 6" step min assist at times, I would release pt hips so she could bear full weight 4x15'' Passive stretch of the LE's Heel slides x  5 each alternating Red tband clamshells Bridges Passive stretch of the LE's  10/08/22 Nustep Level 5 x 6 minutes While she was doing the [employer] I worked on the chair, she  bought the chair out of pocket on [employer] and it does not have lateral trunk supports, she bought these off of [employer] but her mom could not figure them out, I was able to put them on but due to the narrowness of the chair only one would work and she wanted it on the right side to help with support Passive stretch of the LE's Standing with her holding PT shoulders on 6" step, PT blocking knees and providing hip extension to neutral and having her use her trunk for posture, 2 minutes x 2 Red tband clamshells bridges  09/24/22 NuStep L5 x48mins  Reviewed goals  SAQ with help x10 each side  Bridges 2x10  Red tband clamshells 2x10 Green band hip ext in supine 2x10 In chair cable extension and rows 5# 2x10   09/18/22 She was in the mechanized chair today and we used it for standing x 10 minutes total with PT facilitating some increase of knee extension, hip extension and then PT doing some Pectoral stretches from behind her  Transferred with set up and CGA to the mat table PROM to the LE's, knees, ankles, hips(adductors and rotation) Sliding board active hip abduction, some hip ER/IR actively Bridges Red tband clamshells Green tband hip extension in supine Gentle STM to the right upper trap and rhomboid MHP/IFC to this area in sitting at end of treatment  09/11/22 Nustep level 3 x 6 minutes Passive stretch of the HS, adductors and hip flexors STM to the right upper trap, necka nd rhomboids Standing with ball b/n knees, knees blocked and PT hands on hips working on weight bearing and knee extension and back extensio Transfers with Min A mostly set up, I did try to fix the brakes on the W/C but it did not work as it seems the tread on the tire is worn out  PATIENT EDUCATION:  Education details: POC/HEP Person educated: Patient and Parent Education method: Explanation, Facilities manager, Actor cues, Verbal cues, and Handouts Education comprehension: verbalized understanding  HOME EXERCISE  PROGRAM: Access Code: [email or web address] URL: [email or web address] Date: 07/11/2022 Prepared by: Stacie Glaze  Exercises - Seated Hamstring Stretch with Chair  - 1 x daily - 7 x weekly - 2 sets - 10 reps - 30 hold - Supine Hip Adductor Stretch  - 1 x daily - 7 x weekly - 2 sets - 10 reps - 30 hold  ASSESSMENT:  CLINICAL IMPRESSION: Patient feels that transfers have gotten easier except when having to go from lower to higher surfaces. Tried to do bridges today but she is unable to lift up much. Worked on some more standing and LE weightbearing today. She is able to pull up about 50% on her own then needs some assistance to push hips forward.   OBJECTIVE IMPAIRMENTS: Abnormal gait, cardiopulmonary status limiting activity, decreased activity tolerance, decreased balance, decreased coordination, decreased endurance, decreased mobility, difficulty walking, decreased ROM, decreased strength, increased fascial restrictions, increased muscle spasms, impaired flexibility, postural dysfunction, and pain.   REHAB POTENTIAL: Good  CLINICAL DECISION MAKING: Stable/uncomplicated  EVALUATION COMPLEXITY: Low   GOALS: Goals reviewed with patient? Yes  SHORT TERM GOALS: Target date: 08/01/22 Independent with initial HEP Goal status: still not doing 09/11/22, doing the stretches 09/24/22    LONG TERM GOALS: Target date: 10/10/22  Independent with advanced HEP  Goal status: ongoing 10/09/22  2.  Perform stand pivot transfer independently Goal status:ongoing 08/28/22, MET 09/24/22  3.  Maintain knee ROM to within 25 degrees of full extension Goal status: progressing 11/05/22  4.  Independent with bed mobility and transfers Goal status: progressing SBA 11/05/22  5.  Do a sliding board transfer into the car Goal status: ongoing 08/28/22, ongoing 09/24/22  PLAN:  PT FREQUENCY: 1-2x/week  PT DURATION: 12 weeks  PLANNED INTERVENTIONS: Therapeutic exercises, Therapeutic activity, Neuromuscular  re-education, Balance training, Gait training, Patient/Family education, Self Care, Joint mobilization, Dry Needling, Electrical stimulation, and Manual therapy  PLAN FOR NEXT SESSION: really work on standing, she really cannot activate the quads, work on passive stretch, she changed insurances and we will have to request visits  try w/c mobility   [employer], PT 11/19/2022, 11:21 AM

## 2022-11-19 ENCOUNTER — Ambulatory Visit: Payer: MEDICAID

## 2022-11-19 DIAGNOSIS — R531 Weakness: Secondary | ICD-10-CM | POA: Diagnosis not present

## 2022-11-19 DIAGNOSIS — M62830 Muscle spasm of back: Secondary | ICD-10-CM

## 2022-11-19 DIAGNOSIS — R252 Cramp and spasm: Secondary | ICD-10-CM

## 2022-11-26 ENCOUNTER — Ambulatory Visit: Payer: MEDICAID | Admitting: Adult Health

## 2022-11-26 ENCOUNTER — Encounter: Payer: Self-pay | Admitting: Adult Health

## 2022-11-26 VITALS — BP 143/92 | HR 111

## 2022-11-26 DIAGNOSIS — G801 Spastic diplegic cerebral palsy: Secondary | ICD-10-CM

## 2022-11-26 DIAGNOSIS — G43709 Chronic migraine without aura, not intractable, without status migrainosus: Secondary | ICD-10-CM

## 2022-11-26 DIAGNOSIS — R768 Other specified abnormal immunological findings in serum: Secondary | ICD-10-CM

## 2022-11-26 MED ORDER — DULOXETINE HCL 60 MG PO CPEP: 60.0000 mg | ORAL_CAPSULE | Freq: Every day | ORAL | 3 refills | Status: DC

## 2022-11-26 MED ORDER — PROPRANOLOL HCL 10 MG PO TABS: 10.0000 mg | ORAL_TABLET | Freq: Two times a day (BID) | ORAL | 5 refills | Status: DC

## 2022-11-26 NOTE — Patient Instructions (Addendum)
Recommend starting propranolol 10mg  twice daily for headache prevention  Continue duloxetine 60mg  daily  Continue use of Excedrin as needed for migraines   We will repeat ANA testing today - you will be notified of the results once we have them      Follow up in 6 months or call earlier if needed      Propranolol Tablets What is this medication? PROPRANOLOL (proe PRAN oh lole) treats many conditions such as high blood pressure, tremors, and a type of arrhythmia known as AFib (atrial fibrillation). It works by lowering your blood pressure and heart rate, making it easier for your heart to pump blood to the rest of your body. It may be used to prevent migraine headaches. It works by relaxing the blood vessels in the brain that cause migraines. It belongs to a group of medications called beta blockers. This medicine may be used for other purposes; ask your health care provider or pharmacist if you have questions. COMMON BRAND NAME(S): Inderal What should I tell my care team before I take this medication? They need to know if you have any of these conditions: Diabetes Having surgery Heart or blood vessel conditions, such as slow heartbeat, heart failure, heart block Kidney disease Liver disease Lung or breathing disease, such as asthma or COPD Myasthenia gravis Pheochromocytoma Thyroid disease An unusual or allergic reaction to propranolol, other medications, foods, dyes, or preservatives Pregnant or trying to get pregnant Breastfeeding How should I use this medication? Take this medication by mouth. Take it as directed on the prescription label at the same time every day. Keep taking it unless your care team tells you to stop. Talk to your care team about the use of this medication in children. Special care may be needed. Overdosage: If you think you have taken too much of this medicine contact a poison control center or emergency room at once. NOTE: This medicine is only for  you. Do not share this medicine with others. What if I miss a dose? If you miss a dose, take it as soon as you can. If it is almost time for your next dose, take only that dose. Do not take double or extra doses. What may interact with this medication? Do not take this medication with any of the following: Thioridazine This medication may also interact with the following: Certain medications for blood pressure, heart disease, irregular heartbeat Epinephrine NSAIDs, medications for pain and inflammation, such as ibuprofen or naproxen Warfarin Other medications may affect the way this medication works. Talk with your care team about all of the medications you take. They may suggest changes to your treatment plan to lower the risk of side effects and to make sure your medications work as intended. This list may not describe all possible interactions. Give your health care provider a list of all the medicines, herbs, non-prescription drugs, or dietary supplements you use. Also tell them if you smoke, drink alcohol, or use illegal drugs. Some items may interact with your medicine. What should I watch for while using this medication? Visit your care team for regular checks on your progress. Check your blood pressure as directed. Know what your blood pressure should be and when to contact your care team. This medication may affect your coordination, reaction time, or judgment. Do not drive or operate machinery until you know how this medication affects you. Sit up or stand slowly to reduce the risk of dizzy or fainting spells. Drinking alcohol with this medication can increase  the risk of these side effects. Do not suddenly stop taking this medication. This may increase your risk of side effects, such as chest pain and heart attack. If you no longer need to take this medication, your care team will lower the dose slowly over time to decrease the risk of side effects. If you are going to need surgery or a  procedure, tell your care team that you are using this medication. This medication may affect blood glucose levels. It can also mask the symptoms of low blood sugar, such as a rapid heartbeat and tremors. If you have diabetes, it is important to check your blood sugar often while you are taking this medication. Do not treat yourself for coughs, colds, or pain while you are using this medication without asking your care team for advice. Some medications may increase your blood pressure. What side effects may I notice from receiving this medication? Side effects that you should report to your care team as soon as possible: Allergic reactions--skin rash, itching, hives, swelling of the face, lips, tongue, or throat Heart failure--shortness of breath, swelling of the ankles, feet, or hands, sudden weight gain, unusual weakness or fatigue Low blood pressure--dizziness, feeling faint or lightheaded, blurry vision Raynaud's--cool, numb, or painful fingers or toes that may change color from pale, to blue, to red Redness, blistering, peeling, or loosening of the skin, including inside the mouth Slow heartbeat--dizziness, feeling faint or lightheaded, confusion, trouble breathing, unusual weakness or fatigue Worsening mood, feelings of depression Side effects that usually do not require medical attention (report to your care team if they continue or are bothersome): Change in sex drive or performance Diarrhea Dizziness Fatigue Headache This list may not describe all possible side effects. Call your doctor for medical advice about side effects. You may report side effects to FDA at 1-800-FDA-1088. Where should I keep my medication? Keep out of the reach of children and pets. Store at room temperature between 20 and 25 degrees C (68 and 77 degrees F). Protect from light. Throw away any unused medication after the expiration date. NOTE: This sheet is a summary. It may not cover all possible information. If  you have questions about this medicine, talk to your doctor, pharmacist, or health care provider.  2024 Elsevier/Gold Standard (2022-03-18 00:00:00)

## 2022-11-26 NOTE — Progress Notes (Signed)
Chief Complaint  Patient presents with   Follow-up    Patient in room #3 and alone.     ASSESSMENT AND PLAN  Melissa Wagner is a 27 y.o. female  Chronic migraine headaches, Worsening migraine  Some improvement on duloxetine 60 mg daily but still occurring 3-4x per month now lasting up to 48 hours.   Continue duloxetine 60 mg daily  Recommend trialing propranolol 10mg  BID - will start at low dose per patient request and hx of multiple medication intolerances.  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 Berline Wagner PMR  Excessive fatigue, body ache pain  Laboratory evaluation showed positive ANA with elevated ENA RNP ab of unknown clinical significance, repeat today    Follow-up in 6 months or call earlier if needed     DIAGNOSTIC DATA (LABS, IMAGING, TESTING) - I reviewed patient records, labs, notes, testing and imaging myself where available.   MEDICAL HISTORY:    Consult visit 11/15/2021 Dr. Terrace Wagner: Melissa Wagner is a 27 year old female, seen in request by his primary care at  [facility] Dr. Berline Wagner, [facility] for evaluation of headache, initial evaluation was on November 15, 2021  I reviewed and summarized the referring note. PMHX. Cerebral palsy Anxiety since Sept 2022  She was born with cerebral palsy, involving lower extremity more than arm, gait abnormality, gradual worsening gait abnormality, spent most of the time in wheelchair now, She just graduated from Masters degree, last year was the first year she has to live outside of her home, it was quite stressful  She has a long history of migraine, as long as she could remember, sometimes preceded by visual aura, floaters in visual field, pounding headache with  light noise sensitivity, nausea, lasting for 1 day,  While she was in graduate school, she was having migraines couple times a week, since she returned home, over the past 3 weeks, she also suffered 1-2 migraines,  She has been drinking excessive amount of caffeine to help her migraine, use Excedrin Migraine as needed with some help  UPDATE Mar 21 2022 Dr. Terrace Wagner: She complains of increased migraine headache over the past few months, has missed her every 6 weeks trigger point injection, now complains of worsening muscle spasm along her spine, neck, often triggered her migraine headaches  She cannot also get a supply of her dantrolene from local pharmacy due to lack of supply,  She complains of increased diffuse body spasm, I offered multiple medication choice including baclofen, could not tolerated in the past, tizanidine, worried about the side effect  She is having migraine headache 1-2 times each week, stated insurance would not pay for Imitrex, now taking over-the-counter Excedrin Migraine once or twice every week as needed,  I also offered Botox injection for potential muscle spasm, migraine prevention, she reported she has tried Botox for lower extremity spasticity when she was 76 or 27 years old, has build up tolerance for it  She has pending orthopedic surgical procedure for lower extremity spasticity, tendon contraction  UPDATE July 22 2022 Dr. Terrace Wagner: She came in complains of increased migraine headaches, she had bilateral adductor and hamstring muscle lengthening procedure recently, recovering well  Since March, she noticed increased migraine, 2-3 times each week, worried  about the interaction of triptan with her Cymbalta, she is no longer taking Maxalt,  Still dealing with depression anxiety, sleeps well, doing physical therapy now, complains of body achy pain   Update 11/26/2022 Melissa Wagner:   Migraines have worsened over the summer, heat can be a trigger typically. Started taking duloxetine  60mg  daily back in March (was not taking consistently prior) with improvement of migraines down to 3-4 migraines per month (prior 5-7 per week) but during the recent warmer months, she has noticed migraine duration has worsened, can last up to 48 hours (prior only lasted about 8 hours). She also feels duloxetine has slightly worsened her heat intolerance but also noticed some benefit in regards to her anxiety. Will take Excedrin usually with benefit.  Insurance will not cover Nurtec.  At prior visit recommended starting Ritalin for excessive fatigue but did not start due to concern of making spasticity and anxiety worse. Lab work completed at prior visit to look for inflammatory markers which showed positive ANA with positive ENA RNP antibodies, unknown clinical significance and recommended considering repeat laboratory evaluation later.  Sed rate, CRP and CK normal.  [facility]. Followed by Dr. Berline Wagner PMR for CP-spastic diplegia.      PHYSICAL EXAM:   Vitals:   11/26/22 1351  BP: (!) 143/92  Pulse: (!) 111    There is no height or weight on file to calculate BMI.  PHYSICAL EXAMNIATION:  Gen: NAD, conversant, well nourised, well groomed                     Cardiovascular: Regular rate rhythm, no peripheral edema, warm, nontender. Eyes: Conjunctivae clear without exudates or hemorrhage Neck: Supple, no carotid bruits. Pulmonary: Clear to auscultation bilaterally   NEUROLOGICAL EXAM:  MENTAL STATUS: Speech/cognition: Awake, alert, oriented to history taking and casual conversation CRANIAL NERVES: CN II: Visual fields are full to confrontation. Pupils are round equal and briskly reactive to light. CN III, IV, VI: extraocular movement are normal. No ptosis. CN V: Facial sensation is intact to light touch CN VII: Face is symmetric with normal eye closure  CN VIII: Hearing is normal to causal conversation. CN IX, X: Phonation is normal. CN XI:  Head turning and shoulder shrug are intact  MOTOR: Patient sitting in wheelchair, upper extremity strengths is fairly normal, mild posturing of left hand, fixation of left upper extremity on rapid rotating movement, hypoplasia of bilateral lower extremity, mild to moderate bilateral lower extremity muscle spasticity, mild proximal distal weakness, able to move bilateral lower extremities against gravity   SENSORY: Intact to light touch,  COORDINATION: There is no trunk or limb dysmetria noted.  GAIT/STANCE: deferred  REVIEW OF SYSTEMS:  Full 14 system review of systems performed and notable only for as above All other review of systems were negative.   ALLERGIES: Allergies  Allergen Reactions   Penicillins Hives    Other Reaction(s): hives   Amoxicillin Rash    Other reaction(s): Unknown   Baclofen Nausea And Vomiting and Palpitations    Other reaction(s): Unknown  N&V    HOME MEDICATIONS: Current Outpatient Medications  Medication Sig Dispense Refill   acetaminophen (TYLENOL) 500 MG tablet Take 500 mg by mouth every 6 (six) hours as needed.     aspirin-acetaminophen-caffeine (EXCEDRIN MIGRAINE) 250-250-65 MG tablet Take by mouth every 6 (six) hours as needed for headache.     dantrolene (DANTRIUM) 100 MG capsule Take 1 capsule (100 mg total) by  mouth 2 (two) times daily. For spasticity- was shorted last month- make sure not shorted- 180 capsule 3   DULoxetine (CYMBALTA) 60 MG capsule Take 1 capsule (60 mg total) by mouth daily. 90 capsule 3   escitalopram (LEXAPRO) 10 MG tablet Take 10 mg by mouth at bedtime.     methocarbamol (ROBAXIN) 500 MG tablet Take 1 tablet (500 mg total) by mouth every 6 (six) hours as needed for muscle spasms (for muscle tightness that's NOT spasticity). 180 tablet 3   methylphenidate (RITALIN) 5 MG tablet Take 1 tablet (5 mg total) by mouth daily. 30 tablet 0   norethindrone-ethinyl estradiol 1/35 (ORTHO-NOVUM) tablet Take 1 tablet by mouth daily.      promethazine (PHENERGAN) 12.5 MG tablet Take 1 tablet (12.5 mg total) by mouth every 6 (six) hours as needed for nausea or vomiting. 60 tablet 1   Current Facility-Administered Medications  Medication Dose Route Frequency Provider Last Rate Last Admin   lidocaine (XYLOCAINE) 1 % (with pres) injection 3 mL  3 mL Other Once Lovorn, Megan, MD       lidocaine (XYLOCAINE) 1 % (with pres) injection 6 mL  6 mL Other Once Lovorn, Aundra Millet, MD        PAST MEDICAL HISTORY: Past Medical History:  Diagnosis Date   Anxiety    Cerebral palsy (HCC)    Dysmenorrhea     PAST SURGICAL HISTORY: Past Surgical History:  Procedure Laterality Date   ADDUCTOR RELEASE     at age 48   dorsal rhizotomy     at age 74   HAMSTRING LENGTHENING     at the age 60    FAMILY HISTORY: Family History  Problem Relation Age of Onset   Hypertension Mother    Hyperlipidemia Mother    Obesity Mother    Hypothyroidism Mother    Hypertension Father    Hyperlipidemia Father     SOCIAL HISTORY: Social History   Socioeconomic History   Marital status: Single    Spouse name: Not on file   Number of children: Not on file   Years of education: Not on file   Highest education level: Not on file  Occupational History   Not on file  Tobacco Use   Smoking status: Never   Smokeless tobacco: Never  Vaping Use   Vaping status: Never Used  Substance and Sexual Activity   Alcohol use: Never   Drug use: Never   Sexual activity: Not Currently    Birth control/protection: Pill  Other Topics Concern   Not on file  Social History Narrative   Not on file   Social Determinants of Health   Financial Resource Strain: Not on file  Food Insecurity: Not on file  Transportation Needs: Not on file  Physical Activity: Not on file  Stress: Not on file  Social Connections: Not on file  Intimate Partner Violence: Not on file      I spent 35 minutes of face-to-face and non-face-to-face time with patient.  This included  previsit chart review, lab review, study review, order entry, electronic health record documentation, patient education and discussion regarding above diagnoses and treatment plan and answered all other questions to patient's satisfaction  Ihor Austin, [facility]  [facility] [address] [place], [place] [address]  Phone [phone] Fax [phone] Note: This document was prepared with digital dictation and possible smart phrase technology. Any transcriptional errors that result from this process are unintentional.

## 2022-11-27 LAB — ANA W/REFLEX IF POSITIVE
Anti JO-1: <0.2 AI (ref 0.0–0.9)
Anti Nuclear Antibody (ANA): POSITIVE — AB
Centromere Ab Screen: <0.2 AI (ref 0.0–0.9)
Chromatin Ab SerPl-aCnc: 0.2 AI (ref 0.0–0.9)
ENA RNP Ab: 3.2 AI — ABNORMAL HIGH (ref 0.0–0.9)
ENA SM Ab Ser-aCnc: <0.2 AI (ref 0.0–0.9)
ENA SSA (RO) Ab: <0.2 AI (ref 0.0–0.9)
ENA SSB (LA) Ab: <0.2 AI (ref 0.0–0.9)
Scleroderma (Scl-70) (ENA) Antibody, IgG: <0.2 AI (ref 0.0–0.9)
dsDNA Ab: <1 [IU]/mL (ref 0–9)

## 2022-12-03 NOTE — Therapy (Signed)
OUTPATIENT PHYSICAL THERAPY LOWER EXTREMITY TREATMENT    Patient Name: Melissa Wagner MRN: [id number] DOB:05/26/1995, 27 y.o., female Today's Date: 12/04/2022  END OF SESSION:  PT End of Session - 12/04/22 1445     Visit Number 16    Date for PT Re-Evaluation 12/30/22    Authorization Type [employer] 4/14    PT Start Time 1445    PT Stop Time 1530    PT Time Calculation (min) 45 min    Activity Tolerance Patient tolerated treatment well    Behavior During Therapy WFL for tasks assessed/performed                 Past Medical History:  Diagnosis Date   Anxiety    Cerebral palsy (HCC)    Dysmenorrhea    Past Surgical History:  Procedure Laterality Date   ADDUCTOR RELEASE     at age 58   dorsal rhizotomy     at age 25   HAMSTRING LENGTHENING     at the age 65   Patient Active Problem List   Diagnosis Date Noted   Piriformis syndrome of left side 08/23/2022   Anxiety 07/22/2022   Contracture of both hamstrings 01/24/2022   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/24/2022   Chronic migraine w/o aura w/o status migrainosus, not intractable 11/15/2021   Spastic diplegic cerebral palsy (HCC) 11/15/2021   Chronic migraine without aura without status migrainosus, not intractable 08/06/2021   Spasticity 10/11/2019   Jaw pain 07/07/2019   Wheelchair dependence 06/18/2019   Diplegic cerebral palsy (HCC) 06/18/2019   Myofascial pain 06/18/2019   Pain of right scapula 06/18/2019    PCP: Genice Rouge, MD  REFERRING PROVIDER: Genice Rouge, MD  REFERRING DIAG: s/p HS, adductor and iliopsoas resection due to contractures from CP  THERAPY DIAG:  Weakness generalized  Spasticity  Muscle spasm of back  Rationale for Evaluation and Treatment: Rehabilitation  ONSET DATE: 05/22/22  SUBJECTIVE:   SUBJECTIVE STATEMENT: Doing well. I am going to start going to the [facility] with my aid Jazzlyn.    PERTINENT HISTORY: Patient is a 27 yr old R handed female with CP- spastic  diplegia and has generalized anxiety disorder- On Sertraline for anxiety. Also has myofascial pain Here for f/u on diplegic CP and also here for TrP injections for pain control.  PAIN:  Are you having pain? Yes right upper trap, neck and rhomboid area, has had theis pain prior to surgery, gets trigger point injections, she has hip painwith sitting and with lying on the left side, pain in the hips and in the lower legs  PRECAUTIONS: None  WEIGHT BEARING RESTRICTIONS: No  FALLS:  Has patient fallen in last 6 months? Yes. Number of falls 1  LIVING ENVIRONMENT: Lives with: lives with their family Lives in: House/apartment Stairs: No Has following equipment at home: Wheelchair (power)  OCCUPATION: graduated with masters degree in SW  PLOF:  prior to surgery she was independent with toilet transfers, reports cannot do this now requires max A now.  She reports independent with bed tranfers and mobility, needs max A now    PATIENT GOALS: toilet transfer on own, keep LE mms stretches  NEXT MD VISIT: next week  OBJECTIVE:    COGNITION: Overall cognitive status: Within functional limits for tasks assessed     SENSATION: WFL   MUSCLE LENGTH: Tight HS, hip flexor, ankles and adductors are tight  PALPATION: Very tight and tender in the neck, upper traps and the rhomboids,  she is tender in the surgical areas as well  LOWER EXTREMITY ROM:  Passive ROM Right eval Left eval R/L 08/22/22 R/L 10/09/22  Hip flexion      Hip extension Cannot get to neutral " 5/5   Hip abduction 10 10  25/20  Hip adduction      Hip internal rotation      Hip external rotation      Knee flexion      Knee extension 25 30 20/25 18/13  Ankle dorsiflexion 0 0    Ankle plantarflexion      Ankle inversion      Ankle eversion       (Blank rows = not tested)  LOWER EXTREMITY MMT:  UE strength 4+/5  MMT Right eval Left eval  Hip flexion 3 3  Hip extension    Hip abduction    Hip adduction     Hip internal rotation    Hip external rotation    Knee flexion    Knee extension 2 2  Ankle dorsiflexion 2 3  Ankle plantarflexion    Ankle inversion    Ankle eversion     (Blank rows = not tested) GAIT: Distance walked: unable  TRANSFERS:  Sit to stand Max A Standing Max A with a walker Car transfer is Max A where her mom lifts her from the chair and puts her in the car. When standing her knees are not strong enough to hold her and flex, she also flexes her trunk, needs knees blocked and then help with pressure on her buttocks to get upright  TODAY'S TREATMENT:                                                                                                                              DATE:   12/04/22 NuStep L5x21mins  Passive LE stretching  SAQ with assist again trying to get quads to work x10 each side  Small STS from table to 6" step and working on [employer] holds  Stand pivot transfer from bed to chair   11/19/22 NuStep L5x87mins  Passive LE stretching  Heel slides and abd slides in supine 2x10 Supine clamshells yellow 2x10 Small STS from table to 6" step and working on [employer] 3x 20s holds    11/12/22 Nustep level 4 x 6 minutes Passive right shoulder ROM Passive stretch to the LE's STM to the neck and upper back Standing with ball b/n knees and blocking knees 4x2 minutes Some small sit to stand motions trying to get her to engage the glutes and the quads SAQ with assist again trying to get quads to work STM to the right calf area  11/05/22 Nustep level 4 x 6 minutes Passive right shoulder ROM Passive stretch to the LE's Standing working on bearing weight on OT mat and a 6" step W/c mobility manual w/c x 100 feet took 65 seconds flat smooth surface Bridges Standing butt out and  then tucks to work on mms in the legs and buttocks  10/22/22 NuStep L3 x 6 min Standing with her holding PT shoulders on 6" step min assist at times, I would release pt hips so she could  bear full weight 4x15'' Passive stretch of the LE's Heel slides x 5 each alternating Red tband clamshells Bridges Passive stretch of the LE's  10/08/22 Nustep Level 5 x 6 minutes While she was doing the [employer] I worked on the chair, she bought the chair out of pocket on [employer] and it does not have lateral trunk supports, she bought these off of [employer] but her mom could not figure them out, I was able to put them on but due to the narrowness of the chair only one would work and she wanted it on the right side to help with support Passive stretch of the LE's Standing with her holding PT shoulders on 6" step, PT blocking knees and providing hip extension to neutral and having her use her trunk for posture, 2 minutes x 2 Red tband clamshells bridges  09/24/22 NuStep L5 x64mins  Reviewed goals  SAQ with help x10 each side  Bridges 2x10  Red tband clamshells 2x10 Green band hip ext in supine 2x10 In chair cable extension and rows 5# 2x10   09/18/22 She was in the mechanized chair today and we used it for standing x 10 minutes total with PT facilitating some increase of knee extension, hip extension and then PT doing some Pectoral stretches from behind her  Transferred with set up and CGA to the mat table PROM to the LE's, knees, ankles, hips(adductors and rotation) Sliding board active hip abduction, some hip ER/IR actively Bridges Red tband clamshells Green tband hip extension in supine Gentle STM to the right upper trap and rhomboid MHP/IFC to this area in sitting at end of treatment  09/11/22 Nustep level 3 x 6 minutes Passive stretch of the HS, adductors and hip flexors STM to the right upper trap, necka nd rhomboids Standing with ball b/n knees, knees blocked and PT hands on hips working on weight bearing and knee extension and back extensio Transfers with Min A mostly set up, I did try to fix the brakes on the W/C but it did not work as it seems the tread on the tire is worn  out  PATIENT EDUCATION:  Education details: POC/HEP Person educated: Patient and Parent Education method: Explanation, Facilities manager, Actor cues, Verbal cues, and Handouts Education comprehension: verbalized understanding  HOME EXERCISE PROGRAM: Access Code: [email or web address] URL: [email or web address] Date: 07/11/2022 Prepared by: Stacie Glaze  Exercises - Seated Hamstring Stretch with Chair  - 1 x daily - 7 x weekly - 2 sets - 10 reps - 30 hold - Supine Hip Adductor Stretch  - 1 x daily - 7 x weekly - 2 sets - 10 reps - 30 hold  ASSESSMENT:  CLINICAL IMPRESSION: Patient feels that transfers have gotten easier except when having to go from lower to higher surfaces. She has an aide to help her now and we went over transfers and equipment she can try at the [facility]. Worked on some more standing and LE weightbearing today. She is able to pull up mostly on her own and then needs some assistance to push hips forward. Can tolerate about 20s standing before needing to sit.    OBJECTIVE IMPAIRMENTS: Abnormal gait, cardiopulmonary status limiting activity, decreased activity tolerance, decreased balance, decreased coordination, decreased endurance, decreased mobility, difficulty walking, decreased ROM, decreased strength,  increased fascial restrictions, increased muscle spasms, impaired flexibility, postural dysfunction, and pain.   REHAB POTENTIAL: Good  CLINICAL DECISION MAKING: Stable/uncomplicated  EVALUATION COMPLEXITY: Low   GOALS: Goals reviewed with patient? Yes  SHORT TERM GOALS: Target date: 08/01/22 Independent with initial HEP Goal status: still not doing 09/11/22, doing the stretches 09/24/22    LONG TERM GOALS: Target date: 10/10/22  Independent with advanced HEP Goal status: ongoing 10/09/22  2.  Perform stand pivot transfer independently Goal status:ongoing 08/28/22, MET 09/24/22  3.  Maintain knee ROM to within 25 degrees of full extension Goal status:  progressing 11/05/22  4.  Independent with bed mobility and transfers Goal status: progressing SBA 11/05/22  5.  Do a sliding board transfer into the car Goal status: ongoing 08/28/22, ongoing 09/24/22  PLAN:  PT FREQUENCY: 1-2x/week  PT DURATION: 12 weeks  PLANNED INTERVENTIONS: Therapeutic exercises, Therapeutic activity, Neuromuscular re-education, Balance training, Gait training, Patient/Family education, Self Care, Joint mobilization, Dry Needling, Electrical stimulation, and Manual therapy  PLAN FOR NEXT SESSION: really work on standing, she really cannot activate the quads, work on passive stretch, she changed insurances and we will have to request visits  try w/c mobility   [place], PT 12/04/2022, 3:28 PM

## 2022-12-04 ENCOUNTER — Ambulatory Visit: Payer: MEDICAID | Attending: Orthopedic Surgery

## 2022-12-04 DIAGNOSIS — R252 Cramp and spasm: Secondary | ICD-10-CM | POA: Insufficient documentation

## 2022-12-04 DIAGNOSIS — R531 Weakness: Secondary | ICD-10-CM | POA: Insufficient documentation

## 2022-12-04 DIAGNOSIS — M62830 Muscle spasm of back: Secondary | ICD-10-CM | POA: Insufficient documentation

## 2022-12-10 ENCOUNTER — Encounter: Payer: Self-pay | Admitting: Physical Therapy

## 2022-12-10 ENCOUNTER — Ambulatory Visit: Payer: MEDICAID | Admitting: Physical Therapy

## 2022-12-10 DIAGNOSIS — R531 Weakness: Secondary | ICD-10-CM | POA: Diagnosis not present

## 2022-12-10 DIAGNOSIS — M62830 Muscle spasm of back: Secondary | ICD-10-CM

## 2022-12-10 DIAGNOSIS — R252 Cramp and spasm: Secondary | ICD-10-CM

## 2022-12-10 NOTE — Therapy (Signed)
OUTPATIENT PHYSICAL THERAPY LOWER EXTREMITY TREATMENT    Patient Name: Melissa Wagner MRN: [id number] DOB:02/28/1996, 27 y.o., female Today's Date: 12/10/2022  END OF SESSION:  PT End of Session - 12/10/22 1302     Visit Number 17    Date for PT Re-Evaluation 12/30/22    PT Start Time 1300    PT Stop Time 1345    PT Time Calculation (min) 45 min    Activity Tolerance Patient tolerated treatment well    Behavior During Therapy WFL for tasks assessed/performed                 Past Medical History:  Diagnosis Date   Anxiety    Cerebral palsy (HCC)    Dysmenorrhea    Past Surgical History:  Procedure Laterality Date   ADDUCTOR RELEASE     at age 34   dorsal rhizotomy     at age 54   HAMSTRING LENGTHENING     at the age 38   Patient Active Problem List   Diagnosis Date Noted   Piriformis syndrome of left side 08/23/2022   Anxiety 07/22/2022   Contracture of both hamstrings 01/24/2022   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/24/2022   Chronic migraine w/o aura w/o status migrainosus, not intractable 11/15/2021   Spastic diplegic cerebral palsy (HCC) 11/15/2021   Chronic migraine without aura without status migrainosus, not intractable 08/06/2021   Spasticity 10/11/2019   Jaw pain 07/07/2019   Wheelchair dependence 06/18/2019   Diplegic cerebral palsy (HCC) 06/18/2019   Myofascial pain 06/18/2019   Pain of right scapula 06/18/2019    PCP: Genice Rouge, MD  REFERRING PROVIDER: Genice Rouge, MD  REFERRING DIAG: s/p HS, adductor and iliopsoas resection due to contractures from CP  THERAPY DIAG:  Weakness generalized  Spasticity  Muscle spasm of back  Rationale for Evaluation and Treatment: Rehabilitation  ONSET DATE: 05/22/22  SUBJECTIVE:   SUBJECTIVE STATEMENT: "Doing ok"    PERTINENT HISTORY: Patient is a 27 yr old R handed female with CP- spastic diplegia and has generalized anxiety disorder- On Sertraline for anxiety. Also has myofascial  pain Here for f/u on diplegic CP and also here for TrP injections for pain control.  PAIN:  Are you having pain? Yes right upper trap, neck and rhomboid area, has had theis pain prior to surgery, gets trigger point injections, she has hip painwith sitting and with lying on the left side, pain in the hips and in the lower legs  PRECAUTIONS: None  WEIGHT BEARING RESTRICTIONS: No  FALLS:  Has patient fallen in last 6 months? Yes. Number of falls 1  LIVING ENVIRONMENT: Lives with: lives with their family Lives in: House/apartment Stairs: No Has following equipment at home: Wheelchair (power)  OCCUPATION: graduated with masters degree in SW  PLOF:  prior to surgery she was independent with toilet transfers, reports cannot do this now requires max A now.  She reports independent with bed tranfers and mobility, needs max A now    PATIENT GOALS: toilet transfer on own, keep LE mms stretches  NEXT MD VISIT: next week  OBJECTIVE:    COGNITION: Overall cognitive status: Within functional limits for tasks assessed     SENSATION: WFL   MUSCLE LENGTH: Tight HS, hip flexor, ankles and adductors are tight  PALPATION: Very tight and tender in the neck, upper traps and the rhomboids, she is tender in the surgical areas as well  LOWER EXTREMITY ROM:  Passive ROM Right eval Left eval  R/L 08/22/22 R/L 10/09/22  Hip flexion      Hip extension Cannot get to neutral " 5/5   Hip abduction 10 10  25/20  Hip adduction      Hip internal rotation      Hip external rotation      Knee flexion      Knee extension 25 30 20/25 18/13  Ankle dorsiflexion 0 0    Ankle plantarflexion      Ankle inversion      Ankle eversion       (Blank rows = not tested)  LOWER EXTREMITY MMT:  UE strength 4+/5  MMT Right eval Left eval  Hip flexion 3 3  Hip extension    Hip abduction    Hip adduction    Hip internal rotation    Hip external rotation    Knee flexion    Knee extension 2 2  Ankle  dorsiflexion 2 3  Ankle plantarflexion    Ankle inversion    Ankle eversion     (Blank rows = not tested) GAIT: Distance walked: unable  TRANSFERS:  Sit to stand Max A Standing Max A with a walker Car transfer is Max A where her mom lifts her from the chair and puts her in the car. When standing her knees are not strong enough to hold her and flex, she also flexes her trunk, needs knees blocked and then help with pressure on her buttocks to get upright  TODAY'S TREATMENT:                                                                                                                              DATE:   12/10/22 NuStep L5x35mins  Passive LE stretching  Bridges x10 Ball squeeze 2x10 SLR 2x5 Small STS from table to 6" step and working on [employer] 5x 5s holds . Seated Rows red x10, x5  12/04/22 NuStep L5x86mins  Passive LE stretching  SAQ with assist again trying to get quads to work x10 each side  Small STS from table to 6" step and working on [employer] holds  Stand pivot transfer from bed to chair   11/19/22 NuStep L5x67mins  Passive LE stretching  Heel slides and abd slides in supine 2x10 Supine clamshells yellow 2x10 Small STS from table to 6" step and working on [employer] 3x 20s holds    11/12/22 Nustep level 4 x 6 minutes Passive right shoulder ROM Passive stretch to the LE's STM to the neck and upper back Standing with ball b/n knees and blocking knees 4x2 minutes Some small sit to stand motions trying to get her to engage the glutes and the quads SAQ with assist again trying to get quads to work STM to the right calf area  11/05/22 Nustep level 4 x 6 minutes Passive right shoulder ROM Passive stretch to the LE's Standing working on bearing weight on OT mat and a 6" step W/c mobility  manual w/c x 100 feet took 65 seconds flat smooth surface Bridges Standing butt out and then tucks to work on mms in the legs and buttocks  10/22/22 NuStep L3 x 6 min Standing with her  holding PT shoulders on 6" step min assist at times, I would release pt hips so she could bear full weight 4x15'' Passive stretch of the LE's Heel slides x 5 each alternating Red tband clamshells Bridges Passive stretch of the LE's  10/08/22 Nustep Level 5 x 6 minutes While she was doing the [employer] I worked on the chair, she bought the chair out of pocket on [employer] and it does not have lateral trunk supports, she bought these off of [employer] but her mom could not figure them out, I was able to put them on but due to the narrowness of the chair only one would work and she wanted it on the right side to help with support Passive stretch of the LE's Standing with her holding PT shoulders on 6" step, PT blocking knees and providing hip extension to neutral and having her use her trunk for posture, 2 minutes x 2 Red tband clamshells bridges  09/24/22 NuStep L5 x17mins  Reviewed goals  SAQ with help x10 each side  Bridges 2x10  Red tband clamshells 2x10 Green band hip ext in supine 2x10 In chair cable extension and rows 5# 2x10  PATIENT EDUCATION:  Education details: POC/HEP Person educated: Patient and Parent Education method: Explanation, Demonstration, Actor cues, Verbal cues, and Handouts Education comprehension: verbalized understanding  HOME EXERCISE PROGRAM: Access Code: [email or web address] URL: [email or web address] Date: 07/11/2022 Prepared by: Stacie Glaze  Exercises - Seated Hamstring Stretch with Chair  - 1 x daily - 7 x weekly - 2 sets - 10 reps - 30 hold - Supine Hip Adductor Stretch  - 1 x daily - 7 x weekly - 2 sets - 10 reps - 30 hold  ASSESSMENT:  CLINICAL IMPRESSION: Continued with some more standing and LE weightbearing today. She is able to pull up mostly on her own and then needs some assistance to push hips forward. Can tolerate about 15s standing before needing to sit.  Postural cues needed with rows as well as cues to root down into her feet when  sitting doing rows. Bilat LE are really tight with passive stretching.  OBJECTIVE IMPAIRMENTS: Abnormal gait, cardiopulmonary status limiting activity, decreased activity tolerance, decreased balance, decreased coordination, decreased endurance, decreased mobility, difficulty walking, decreased ROM, decreased strength, increased fascial restrictions, increased muscle spasms, impaired flexibility, postural dysfunction, and pain.   REHAB POTENTIAL: Good  CLINICAL DECISION MAKING: Stable/uncomplicated  EVALUATION COMPLEXITY: Low   GOALS: Goals reviewed with patient? Yes  SHORT TERM GOALS: Target date: 08/01/22 Independent with initial HEP Goal status: still not doing 09/11/22, doing the stretches 09/24/22    LONG TERM GOALS: Target date: 10/10/22  Independent with advanced HEP Goal status: ongoing 10/09/22  2.  Perform stand pivot transfer independently Goal status:ongoing 08/28/22, MET 09/24/22  3.  Maintain knee ROM to within 25 degrees of full extension Goal status: progressing 11/05/22  4.  Independent with bed mobility and transfers Goal status: progressing SBA 11/05/22  5.  Do a sliding board transfer into the car Goal status: ongoing 08/28/22, ongoing 09/24/22  PLAN:  PT FREQUENCY: 1-2x/week  PT DURATION: 12 weeks  PLANNED INTERVENTIONS: Therapeutic exercises, Therapeutic activity, Neuromuscular re-education, Balance training, Gait training, Patient/Family education, Self Care, Joint mobilization, Dry Needling, Electrical stimulation, and Manual therapy  PLAN FOR NEXT  SESSION: really work on standing, she really cannot activate the quads, work on passive stretch, she changed insurances and we will have to request visits  try w/c mobility   Grayce Sessions, PTA 12/10/2022, 1:03 PM

## 2022-12-11 ENCOUNTER — Ambulatory Visit: Payer: MEDICAID

## 2022-12-18 ENCOUNTER — Ambulatory Visit: Payer: MEDICAID

## 2022-12-25 ENCOUNTER — Ambulatory Visit: Payer: MEDICAID | Admitting: Physical Therapy

## 2022-12-26 ENCOUNTER — Encounter: Payer: Self-pay | Admitting: Physical Therapy

## 2022-12-26 ENCOUNTER — Ambulatory Visit: Payer: MEDICAID | Admitting: Physical Therapy

## 2022-12-26 DIAGNOSIS — R531 Weakness: Secondary | ICD-10-CM | POA: Diagnosis not present

## 2022-12-26 DIAGNOSIS — M62830 Muscle spasm of back: Secondary | ICD-10-CM

## 2022-12-26 DIAGNOSIS — R252 Cramp and spasm: Secondary | ICD-10-CM

## 2022-12-26 NOTE — Therapy (Signed)
OUTPATIENT PHYSICAL THERAPY LOWER EXTREMITY TREATMENT    Patient Name: Melissa Wagner MRN: [id number] DOB:1995/07/22, 27 y.o., female Today's Date: 12/26/2022  END OF SESSION:  PT End of Session - 12/26/22 1852     Visit Number 18    PT Start Time 1430    PT Stop Time 1519    PT Time Calculation (min) 49 min    Activity Tolerance Patient tolerated treatment well    Behavior During Therapy WFL for tasks assessed/performed                  Past Medical History:  Diagnosis Date   Anxiety    Cerebral palsy (HCC)    Dysmenorrhea    Past Surgical History:  Procedure Laterality Date   ADDUCTOR RELEASE     at age 40   dorsal rhizotomy     at age 25   HAMSTRING LENGTHENING     at the age 72   Patient Active Problem List   Diagnosis Date Noted   Piriformis syndrome of left side 08/23/2022   Anxiety 07/22/2022   Contracture of both hamstrings 01/24/2022   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/24/2022   Chronic migraine w/o aura w/o status migrainosus, not intractable 11/15/2021   Spastic diplegic cerebral palsy (HCC) 11/15/2021   Chronic migraine without aura without status migrainosus, not intractable 08/06/2021   Spasticity 10/11/2019   Jaw pain 07/07/2019   Wheelchair dependence 06/18/2019   Diplegic cerebral palsy (HCC) 06/18/2019   Myofascial pain 06/18/2019   Pain of right scapula 06/18/2019    PCP: Genice Rouge, MD  REFERRING PROVIDER: Genice Rouge, MD  REFERRING DIAG: s/p HS, adductor and iliopsoas resection due to contractures from CP  THERAPY DIAG:  Weakness generalized  Spasticity  Muscle spasm of back  Rationale for Evaluation and Treatment: Rehabilitation  ONSET DATE: 05/22/22  SUBJECTIVE:   SUBJECTIVE STATEMENT: Patient reports that she has changed schedules and has not been able to be consistent with PT, she does report having some increased help and that she would like to go to the gym with the help she has   PERTINENT  HISTORY: Patient is a 27 yr old R handed female with CP- spastic diplegia and has generalized anxiety disorder- On Sertraline for anxiety. Also has myofascial pain Here for f/u on diplegic CP and also here for TrP injections for pain control.  PAIN:  Are you having pain? Continues to have neck and upper back spasms especially on the right  PRECAUTIONS: None  WEIGHT BEARING RESTRICTIONS: No  FALLS:  Has patient fallen in last 6 months? Yes. Number of falls 1  LIVING ENVIRONMENT: Lives with: lives with their family Lives in: House/apartment Stairs: No Has following equipment at home: Wheelchair (power)  OCCUPATION: graduated with masters degree in SW  PLOF:  prior to surgery she was independent with toilet transfers, reports cannot do this now requires max A now.  She reports independent with bed tranfers and mobility, needs max A now    PATIENT GOALS: toilet transfer on own, keep LE mms stretches  NEXT MD VISIT: next week  OBJECTIVE:    COGNITION: Overall cognitive status: Within functional limits for tasks assessed     SENSATION: WFL   MUSCLE LENGTH: Tight HS, hip flexor, ankles and adductors are tight  PALPATION: Very tight and tender in the neck, upper traps and the rhomboids, she is tender in the surgical areas as well  LOWER EXTREMITY ROM:  Passive ROM Right eval Left  eval R/L 08/22/22 R/L 10/09/22 R/L  Hip flexion       Hip extension Cannot get to neutral " 5/5    Hip abduction 10 10  25/20 30/25  Hip adduction       Hip internal rotation       Hip external rotation       Knee flexion       Knee extension 25 30 20/25 18/13 15/10   Ankle dorsiflexion 0 0     Ankle plantarflexion       Ankle inversion       Ankle eversion        (Blank rows = not tested)  LOWER EXTREMITY MMT:  UE strength 4+/5  MMT Right eval Left eval  Hip flexion 3 3  Hip extension    Hip abduction    Hip adduction    Hip internal rotation    Hip external rotation     Knee flexion    Knee extension 2 2  Ankle dorsiflexion 2 3  Ankle plantarflexion    Ankle inversion    Ankle eversion     (Blank rows = not tested) GAIT: Distance walked: unable  TRANSFERS:  Sit to stand Max A Standing Max A with a walker Car transfer is Max A where her mom lifts her from the chair and puts her in the car. When standing her knees are not strong enough to hold her and flex, she also flexes her trunk, needs knees blocked and then help with pressure on her buttocks to get upright  TODAY'S TREATMENT:                                                                                                                              DATE:   12/26/22 Nustep level 5 x 6 minutes ROM of the LE measured Passive stretch of the LE's Passive stretch of the trunk Standing with ball b/n knees, knees blocked in front, some support on the buttocks and her hands on PT shoulders, cues to small squat and stand up, cues for hips forward and chest up working on trunk and LE control to help with her ability to stand and transfer, she does report some osteopenia dx recently and we talked about the need for weight bearing and resistance training to help with this, we discussed her taking pictures and or videos if she goes to the gym and we could help her with some of the exercises and critique her so she does not hurt herself and is safe  12/10/22 NuStep L5x34mins  Passive LE stretching  Bridges x10 Ball squeeze 2x10 SLR 2x5 Small STS from table to 6" step and working on [employer] 5x 5s holds . Seated Rows red x10, x5  12/04/22 NuStep L5x58mins  Passive LE stretching  SAQ with assist again trying to get quads to work x10 each side  Small STS from table to 6" step and working on [employer]  holds  Stand pivot transfer from bed to chair   11/19/22 NuStep L5x57mins  Passive LE stretching  Heel slides and abd slides in supine 2x10 Supine clamshells yellow 2x10 Small STS from table to 6" step and  working on [employer] 3x 20s holds    11/12/22 Nustep level 4 x 6 minutes Passive right shoulder ROM Passive stretch to the LE's STM to the neck and upper back Standing with ball b/n knees and blocking knees 4x2 minutes Some small sit to stand motions trying to get her to engage the glutes and the quads SAQ with assist again trying to get quads to work STM to the right calf area PATIENT EDUCATION:  Education details: POC/HEP Person educated: Patient and Parent Education method: Explanation, Facilities manager, Actor cues, Verbal cues, and Handouts Education comprehension: verbalized understanding  HOME EXERCISE PROGRAM: Access Code: [email or web address] URL: [email or web address] Date: 07/11/2022 Prepared by: Stacie Glaze  Exercises - Seated Hamstring Stretch with Chair  - 1 x daily - 7 x weekly - 2 sets - 10 reps - 30 hold - Supine Hip Adductor Stretch  - 1 x daily - 7 x weekly - 2 sets - 10 reps - 30 hold  ASSESSMENT:  CLINICAL IMPRESSION: Continued with some more standing and LE weightbearing today. She is able to pull up mostly on her own and then needs some assistance to push hips forward. Can tolerate about 45s standing before needing to sit due to fatigue and at times pain and tightness in the upper back and neck.  Postural cues needed with rows as well as cues to root down into her feet when sitting doing rows. Bilat LE are really tight with passive stretching.  However with measurements she is still improving.  Biggest issue is she is still not sleeping in her bed due to difficulty getting up on her own and her mom is still lifting her in and out of the car.  OBJECTIVE IMPAIRMENTS: Abnormal gait, cardiopulmonary status limiting activity, decreased activity tolerance, decreased balance, decreased coordination, decreased endurance, decreased mobility, difficulty walking, decreased ROM, decreased strength, increased fascial restrictions, increased muscle spasms, impaired  flexibility, postural dysfunction, and pain.   REHAB POTENTIAL: Good  CLINICAL DECISION MAKING: Stable/uncomplicated  EVALUATION COMPLEXITY: Low   GOALS: Goals reviewed with patient? Yes  SHORT TERM GOALS: Target date: 08/01/22 Independent with initial HEP Goal status: met 12/26/22   LONG TERM GOALS: Target date: 10/10/22  Independent with advanced HEP Goal status: progressing 12/26/22  2.  Perform stand pivot transfer independently Goal status:ongoing 08/28/22, MET 09/24/22  3.  Maintain knee ROM to within 25 degrees of full extension Goal status: progressing 12/26/22  4.  Independent with bed mobility and transfers Goal status: progressing SBA9/26/24  5.  Do a sliding board transfer into the car Goal status: ongoing 08/28/22, ongoing 12/26/22  PLAN:  PT FREQUENCY: 1-2x/week  PT DURATION: 12 weeks  PLANNED INTERVENTIONS: Therapeutic exercises, Therapeutic activity, Neuromuscular re-education, Balance training, Gait training, Patient/Family education, Self Care, Joint mobilization, Dry Needling, Electrical stimulation, and Manual therapy  PLAN FOR NEXT SESSION: really work on standing, she really cannot activate the quads, work on passive stretch, she changed insurances and we will have to request visits  try w/c mobility and really help her mom with the transfers in and out of car and would like to assure her safety if she can go to [employer], PT 12/26/2022, 6:53 PM

## 2022-12-27 ENCOUNTER — Encounter: Payer: MEDICAID | Admitting: Physical Medicine and Rehabilitation

## 2023-01-02 ENCOUNTER — Encounter: Payer: Self-pay | Admitting: Physical Therapy

## 2023-01-02 ENCOUNTER — Ambulatory Visit: Payer: MEDICAID | Attending: Orthopedic Surgery | Admitting: Physical Therapy

## 2023-01-02 DIAGNOSIS — R531 Weakness: Secondary | ICD-10-CM | POA: Diagnosis present

## 2023-01-02 DIAGNOSIS — M62830 Muscle spasm of back: Secondary | ICD-10-CM | POA: Insufficient documentation

## 2023-01-02 DIAGNOSIS — R252 Cramp and spasm: Secondary | ICD-10-CM | POA: Diagnosis present

## 2023-01-02 NOTE — Therapy (Signed)
OUTPATIENT PHYSICAL THERAPY LOWER EXTREMITY TREATMENT    Patient Name: Melissa Wagner MRN: [id number] DOB:02-15-1996, 27 y.o., female Today's Date: 01/02/2023  END OF SESSION:  PT End of Session - 01/02/23 1528     Visit Number 19    Authorization Type [employer] 9/14    PT Start Time 1524    PT Stop Time 1619    PT Time Calculation (min) 55 min    Activity Tolerance Patient tolerated treatment well    Behavior During Therapy WFL for tasks assessed/performed                  Past Medical History:  Diagnosis Date   Anxiety    Cerebral palsy (HCC)    Dysmenorrhea    Past Surgical History:  Procedure Laterality Date   ADDUCTOR RELEASE     at age 60   dorsal rhizotomy     at age 48   HAMSTRING LENGTHENING     at the age 24   Patient Active Problem List   Diagnosis Date Noted   Piriformis syndrome of left side 08/23/2022   Anxiety 07/22/2022   Contracture of both hamstrings 01/24/2022   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/24/2022   Chronic migraine w/o aura w/o status migrainosus, not intractable 11/15/2021   Spastic diplegic cerebral palsy (HCC) 11/15/2021   Chronic migraine without aura without status migrainosus, not intractable 08/06/2021   Spasticity 10/11/2019   Jaw pain 07/07/2019   Wheelchair dependence 06/18/2019   Diplegic cerebral palsy (HCC) 06/18/2019   Myofascial pain 06/18/2019   Pain of right scapula 06/18/2019    PCP: Genice Rouge, MD  REFERRING PROVIDER: Genice Rouge, MD  REFERRING DIAG: s/p HS, adductor and iliopsoas resection due to contractures from CP  THERAPY DIAG:  Weakness generalized  Spasticity  Muscle spasm of back  Rationale for Evaluation and Treatment: Rehabilitation  ONSET DATE: 05/22/22  SUBJECTIVE:   SUBJECTIVE STATEMENT: Patient reports that her neck is very sore and tight today  PERTINENT HISTORY: Patient is a 27 yr old R handed female with CP- spastic diplegia and has generalized anxiety disorder- On  Sertraline for anxiety. Also has myofascial pain Here for f/u on diplegic CP and also here for TrP injections for pain control.  PAIN:  Are you having pain? Continues to have neck and upper back spasms especially on the right  PRECAUTIONS: None  WEIGHT BEARING RESTRICTIONS: No  FALLS:  Has patient fallen in last 6 months? Yes. Number of falls 1  LIVING ENVIRONMENT: Lives with: lives with their family Lives in: House/apartment Stairs: No Has following equipment at home: Wheelchair (power)  OCCUPATION: graduated with masters degree in SW  PLOF:  prior to surgery she was independent with toilet transfers, reports cannot do this now requires max A now.  She reports independent with bed tranfers and mobility, needs max A now    PATIENT GOALS: toilet transfer on own, keep LE mms stretches  NEXT MD VISIT: next week  OBJECTIVE:    COGNITION: Overall cognitive status: Within functional limits for tasks assessed     SENSATION: WFL   MUSCLE LENGTH: Tight HS, hip flexor, ankles and adductors are tight  PALPATION: Very tight and tender in the neck, upper traps and the rhomboids, she is tender in the surgical areas as well  LOWER EXTREMITY ROM:  Passive ROM Right eval Left eval R/L 08/22/22 R/L 10/09/22 R/L  Hip flexion       Hip extension Cannot get to neutral "  5/5    Hip abduction 10 10  25/20 30/25  Hip adduction       Hip internal rotation       Hip external rotation       Knee flexion       Knee extension 25 30 20/25 18/13 15/10   Ankle dorsiflexion 0 0     Ankle plantarflexion       Ankle inversion       Ankle eversion        (Blank rows = not tested)  LOWER EXTREMITY MMT:  UE strength 4+/5  MMT Right eval Left eval  Hip flexion 3 3  Hip extension    Hip abduction    Hip adduction    Hip internal rotation    Hip external rotation    Knee flexion    Knee extension 2 2  Ankle dorsiflexion 2 3  Ankle plantarflexion    Ankle inversion    Ankle  eversion     (Blank rows = not tested) GAIT: Distance walked: unable  TRANSFERS:  Sit to stand Max A Standing Max A with a walker Car transfer is Max A where her mom lifts her from the chair and puts her in the car. When standing her knees are not strong enough to hold her and flex, she also flexes her trunk, needs knees blocked and then help with pressure on her buttocks to get upright  TODAY'S TREATMENT:                                                                                                                              DATE:   01/02/23 STM to the right upper trap, rhomboid and neck area Supine passive stretch: HS, adductor, low back, piriformis, hip flexor and quad Standing 30-60 seconds with small squats and weight shifts and hips front to back Bridges  12/26/22 Nustep level 5 x 6 minutes ROM of the LE measured Passive stretch of the LE's Passive stretch of the trunk Standing with ball b/n knees, knees blocked in front, some support on the buttocks and her hands on PT shoulders, cues to small squat and stand up, cues for hips forward and chest up working on trunk and LE control to help with her ability to stand and transfer, she does report some osteopenia dx recently and we talked about the need for weight bearing and resistance training to help with this, we discussed her taking pictures and or videos if she goes to the gym and we could help her with some of the exercises and critique her so she does not hurt herself and is safe  12/10/22 NuStep L5x36mins  Passive LE stretching  Bridges x10 Ball squeeze 2x10 SLR 2x5 Small STS from table to 6" step and working on [employer] 5x 5s holds . Seated Rows red x10, x5  12/04/22 NuStep L5x78mins  Passive LE stretching  SAQ with assist again trying to get quads to work  x10 each side  Small STS from table to 6" step and working on [employer] 20s holds  Stand pivot transfer from bed to chair   11/19/22 NuStep L5x35mins  Passive LE  stretching  Heel slides and abd slides in supine 2x10 Supine clamshells yellow 2x10 Small STS from table to 6" step and working on [employer] 3x 20s holds    11/12/22 Nustep level 4 x 6 minutes Passive right shoulder ROM Passive stretch to the LE's STM to the neck and upper back Standing with ball b/n knees and blocking knees 4x2 minutes Some small sit to stand motions trying to get her to engage the glutes and the quads SAQ with assist again trying to get quads to work STM to the right calf area PATIENT EDUCATION:  Education details: POC/HEP Person educated: Patient and Parent Education method: Explanation, Facilities manager, Actor cues, Verbal cues, and Handouts Education comprehension: verbalized understanding  HOME EXERCISE PROGRAM: Access Code: [email or web address] URL: [email or web address] Date: 07/11/2022 Prepared by: Stacie Glaze  Exercises - Seated Hamstring Stretch with Chair  - 1 x daily - 7 x weekly - 2 sets - 10 reps - 30 hold - Supine Hip Adductor Stretch  - 1 x daily - 7 x weekly - 2 sets - 10 reps - 30 hold  ASSESSMENT:  CLINICAL IMPRESSION: Continued with some more standing and LE weightbearing today. She is able to pull up mostly on her own and then needs some assistance to push hips forward. Can tolerate about 45s standing before needing to sit due to fatigue and at times pain and tightness in the upper back and neck.  Postural cues needed with rows as well as cues to root down into her feet when sitting doing rows. Bilat LE are really tight with passive stretching.  However with measurements she is still improving.  Biggest issue is she is still not sleeping in her bed due to difficulty getting up on her own and her mom is still lifting her in and out of the car.  OBJECTIVE IMPAIRMENTS: Abnormal gait, cardiopulmonary status limiting activity, decreased activity tolerance, decreased balance, decreased coordination, decreased endurance, decreased mobility,  difficulty walking, decreased ROM, decreased strength, increased fascial restrictions, increased muscle spasms, impaired flexibility, postural dysfunction, and pain.   REHAB POTENTIAL: Good  CLINICAL DECISION MAKING: Stable/uncomplicated  EVALUATION COMPLEXITY: Low   GOALS: Goals reviewed with patient? Yes  SHORT TERM GOALS: Target date: 08/01/22 Independent with initial HEP Goal status: met 12/26/22   LONG TERM GOALS: Target date: 10/10/22  Independent with advanced HEP Goal status: progressing 12/26/22  2.  Perform stand pivot transfer independently Goal status:ongoing 08/28/22, MET 09/24/22  3.  Maintain knee ROM to within 25 degrees of full extension Goal status: progressing 12/26/22  4.  Independent with bed mobility and transfers Goal status: progressing SBA9/26/24  5.  Do a sliding board transfer into the car Goal status: ongoing 08/28/22, ongoing 12/26/22  PLAN:  PT FREQUENCY: 1-2x/week  PT DURATION: 12 weeks  PLANNED INTERVENTIONS: Therapeutic exercises, Therapeutic activity, Neuromuscular re-education, Balance training, Gait training, Patient/Family education, Self Care, Joint mobilization, Dry Needling, Electrical stimulation, and Manual therapy  PLAN FOR NEXT SESSION: really work on standing, she really cannot activate the quads, work on passive stretch, she changed insurances and we will have to request visits  try w/c mobility and really help her mom with the transfers in and out of car and would like to assure her safety if she can go to the gym   [employer]  W, PT 01/02/2023, 3:29 PM

## 2023-01-10 ENCOUNTER — Encounter: Payer: MEDICAID | Attending: Physical Medicine and Rehabilitation | Admitting: Physical Medicine and Rehabilitation

## 2023-01-10 ENCOUNTER — Encounter: Payer: Self-pay | Admitting: Physical Medicine and Rehabilitation

## 2023-01-10 VITALS — BP 125/82 | HR 108 | Ht <= 58 in | Wt 107.0 lb

## 2023-01-10 DIAGNOSIS — M7918 Myalgia, other site: Secondary | ICD-10-CM | POA: Insufficient documentation

## 2023-01-10 MED ORDER — LIDOCAINE HCL 1 % IJ SOLN
9.0000 mL | Freq: Once | INTRAMUSCULAR | Status: AC
Start: 2023-01-10 — End: 2023-01-10
  Administered 2023-01-10: 9 mL
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
## 2023-01-10 NOTE — Patient Instructions (Signed)
Plan: Ask pharmacy for prior auth for 90 days of dantrolene    2. Off Cymbalta- since causing night terrors and dropped BP ot come off it.   3. I don't think foot swelling is due to medications, based on med review- Also not sure it would be (+) ANA, since doesn't point in a particular pathology.    4. Patient here for trigger point injections for  Consent done and on chart.  Cleaned areas with alcohol and injected using a 27 gauge 1.5 inch needle  Injected 4cc- 2cc wasted Using 1% Lidocaine with no EPI  Upper traps B/L-  Levators R only Posterior scalenes Middle scalenes- B/L  Splenius Capitus Pectoralis Major Rhomboids- R x2 and L x1 Infraspinatus Teres Major/minor Thoracic paraspinals Lumbar paraspinals Other injections-    Patient's level of pain prior was 3/10 Current level of pain after injections is- still 3/10  There was no bleeding or complications.  Patient was advised to drink a lot of water on day after injections to flush system Will have increased soreness for 12-48 hours after injections.  Can use Lidocaine patches the day AFTER injections Can use theracane on day of injections in places didn't inject Can use heating pad 4-6 hours AFTER injections - muscles looser in R rhomboids than usual-   5. F/U in 6 weeks-  Trp Injections and f/u on CP

## 2023-01-16 ENCOUNTER — Ambulatory Visit: Payer: MEDICAID | Admitting: Physical Therapy

## 2023-01-16 ENCOUNTER — Encounter: Payer: Self-pay | Admitting: Physical Therapy

## 2023-01-16 DIAGNOSIS — M62830 Muscle spasm of back: Secondary | ICD-10-CM

## 2023-01-16 DIAGNOSIS — R531 Weakness: Secondary | ICD-10-CM

## 2023-01-16 DIAGNOSIS — R252 Cramp and spasm: Secondary | ICD-10-CM

## 2023-01-16 NOTE — Therapy (Signed)
OUTPATIENT PHYSICAL THERAPY LOWER EXTREMITY TREATMENT    Patient Name: Melissa Wagner MRN: [id number] DOB:05/28/1995, 27 y.o., female Today's Date: 01/16/2023  END OF SESSION:  PT End of Session - 01/16/23 1351     Visit Number 20    PT Start Time 1345    PT Stop Time 1430    PT Time Calculation (min) 45 min    Activity Tolerance Patient tolerated treatment well    Behavior During Therapy WFL for tasks assessed/performed                  Past Medical History:  Diagnosis Date   Anxiety    Cerebral palsy (HCC)    Dysmenorrhea    Past Surgical History:  Procedure Laterality Date   ADDUCTOR RELEASE     at age 54   dorsal rhizotomy     at age 30   HAMSTRING LENGTHENING     at the age 17   Patient Active Problem List   Diagnosis Date Noted   Piriformis syndrome of left side 08/23/2022   Anxiety 07/22/2022   Contracture of both hamstrings 01/24/2022   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/24/2022   Chronic migraine w/o aura w/o status migrainosus, not intractable 11/15/2021   Spastic diplegic cerebral palsy (HCC) 11/15/2021   Chronic migraine without aura without status migrainosus, not intractable 08/06/2021   Spasticity 10/11/2019   Jaw pain 07/07/2019   Wheelchair dependence 06/18/2019   Diplegic cerebral palsy (HCC) 06/18/2019   Myofascial pain 06/18/2019   Pain of right scapula 06/18/2019    PCP: Genice Rouge, MD  REFERRING PROVIDER: Genice Rouge, MD  REFERRING DIAG: s/p HS, adductor and iliopsoas resection due to contractures from CP  THERAPY DIAG:  Weakness generalized  Spasticity  Muscle spasm of back  Rationale for Evaluation and Treatment: Rehabilitation  ONSET DATE: 05/22/22  SUBJECTIVE:   SUBJECTIVE STATEMENT: Doing ok, want too be stronger in the upper body and maintain ROM in LE PERTINENT HISTORY: Patient is a 27 yr old R handed female with CP- spastic diplegia and has generalized anxiety disorder- On Sertraline for anxiety. Also  has myofascial pain Here for f/u on diplegic CP and also here for TrP injections for pain control.  PAIN:  Are you having pain? Continues to have neck and upper back spasms especially on the right  PRECAUTIONS: None  WEIGHT BEARING RESTRICTIONS: No  FALLS:  Has patient fallen in last 6 months? Yes. Number of falls 1  LIVING ENVIRONMENT: Lives with: lives with their family Lives in: House/apartment Stairs: No Has following equipment at home: Wheelchair (power)  OCCUPATION: graduated with masters degree in SW  PLOF:  prior to surgery she was independent with toilet transfers, reports cannot do this now requires max A now.  She reports independent with bed tranfers and mobility, needs max A now    PATIENT GOALS: toilet transfer on own, keep LE mms stretches  NEXT MD VISIT: next week  OBJECTIVE:    COGNITION: Overall cognitive status: Within functional limits for tasks assessed     SENSATION: WFL   MUSCLE LENGTH: Tight HS, hip flexor, ankles and adductors are tight  PALPATION: Very tight and tender in the neck, upper traps and the rhomboids, she is tender in the surgical areas as well  LOWER EXTREMITY ROM:  Passive ROM Right eval Left eval R/L 08/22/22 R/L 10/09/22 R/L  Hip flexion       Hip extension Cannot get to neutral " 5/5  Hip abduction 10 10  25/20 30/25  Hip adduction       Hip internal rotation       Hip external rotation       Knee flexion       Knee extension 25 30 20/25 18/13 15/10   Ankle dorsiflexion 0 0     Ankle plantarflexion       Ankle inversion       Ankle eversion        (Blank rows = not tested)  LOWER EXTREMITY MMT:  UE strength 4+/5  MMT Right eval Left eval  Hip flexion 3 3  Hip extension    Hip abduction    Hip adduction    Hip internal rotation    Hip external rotation    Knee flexion    Knee extension 2 2  Ankle dorsiflexion 2 3  Ankle plantarflexion    Ankle inversion    Ankle eversion     (Blank rows = not  tested) GAIT: Distance walked: unable  TRANSFERS:  Sit to stand Max A Standing Max A with a walker Car transfer is Max A where her mom lifts her from the chair and puts her in the car. When standing her knees are not strong enough to hold her and flex, she also flexes her trunk, needs knees blocked and then help with pressure on her buttocks to get upright  TODAY'S TREATMENT:                                                                                                                              DATE:   01/16/23 Seated Rows green 2x10 Horizontal shoulder abd 2x10 yellow Shoulder Flex AAROM 2lb WaTE x10 STM to R rhomboid area Standing 10-20'' x5  LAQ x10 each with limited ROM Passive stretching to HS and hip add  01/02/23 STM to the right upper trap, rhomboid and neck area Supine passive stretch: HS, adductor, low back, piriformis, hip flexor and quad Standing 30-60 seconds with small squats and weight shifts and hips front to back Bridges  12/26/22 Nustep level 5 x 6 minutes ROM of the LE measured Passive stretch of the LE's Passive stretch of the trunk Standing with ball b/n knees, knees blocked in front, some support on the buttocks and her hands on PT shoulders, cues to small squat and stand up, cues for hips forward and chest up working on trunk and LE control to help with her ability to stand and transfer, she does report some osteopenia dx recently and we talked about the need for weight bearing and resistance training to help with this, we discussed her taking pictures and or videos if she goes to the gym and we could help her with some of the exercises and critique her so she does not hurt herself and is safe  12/10/22 NuStep L5x12mins  Passive LE stretching  Bridges x10 Ball squeeze 2x10 SLR 2x5 Small STS from table  to 6" step and working on [employer] 5x 5s holds . Seated Rows red x10, x5  12/04/22 NuStep L5x24mins  Passive LE stretching  SAQ with assist again trying to  get quads to work x10 each side  Small STS from table to 6" step and working on [employer] 20s holds  Stand pivot transfer from bed to chair   11/19/22 NuStep L5x77mins  Passive LE stretching  Heel slides and abd slides in supine 2x10 Supine clamshells yellow 2x10 Small STS from table to 6" step and working on [employer] 3x 20s holds    11/12/22 Nustep level 4 x 6 minutes Passive right shoulder ROM Passive stretch to the LE's STM to the neck and upper back Standing with ball b/n knees and blocking knees 4x2 minutes Some small sit to stand motions trying to get her to engage the glutes and the quads SAQ with assist again trying to get quads to work STM to the right calf area PATIENT EDUCATION:  Education details: POC/HEP Person educated: Patient and Parent Education method: Explanation, Facilities manager, Actor cues, Verbal cues, and Handouts Education comprehension: verbalized understanding  HOME EXERCISE PROGRAM: Access Code: [email or web address] URL: [email or web address] Date: 07/11/2022 Prepared by: Stacie Glaze  Exercises - Seated Hamstring Stretch with Chair  - 1 x daily - 7 x weekly - 2 sets - 10 reps - 30 hold - Supine Hip Adductor Stretch  - 1 x daily - 7 x weekly - 2 sets - 10 reps - 30 hold  ASSESSMENT:  CLINICAL IMPRESSION: Continued with some more standing and LE weightbearing today. She is able to pull up mostly on her own and then needs some assistance to push hips forward. Can tolerate standing but does fatigue, at times pain and tightness in the upper back and neck.  Postural cues needed with rows as well as cues to root down into her feet when sitting doing rows. Bilat LE are really tight with passive stretching.   Biggest issue remains  is still not sleeping in her bed due to difficulty getting up on her own and her mom is still lifting her in and out of the car.  OBJECTIVE IMPAIRMENTS: Abnormal gait, cardiopulmonary status limiting activity, decreased activity  tolerance, decreased balance, decreased coordination, decreased endurance, decreased mobility, difficulty walking, decreased ROM, decreased strength, increased fascial restrictions, increased muscle spasms, impaired flexibility, postural dysfunction, and pain.   REHAB POTENTIAL: Good  CLINICAL DECISION MAKING: Stable/uncomplicated  EVALUATION COMPLEXITY: Low   GOALS: Goals reviewed with patient? Yes  SHORT TERM GOALS: Target date: 08/01/22 Independent with initial HEP Goal status: met 12/26/22   LONG TERM GOALS: Target date: 10/10/22  Independent with advanced HEP Goal status: progressing 12/26/22  2.  Perform stand pivot transfer independently Goal status:ongoing 08/28/22, MET 09/24/22  3.  Maintain knee ROM to within 25 degrees of full extension Goal status: progressing 12/26/22  4.  Independent with bed mobility and transfers Goal status: progressing SBA9/26/24  5.  Do a sliding board transfer into the car Goal status: ongoing 08/28/22, ongoing 12/26/22  PLAN:  PT FREQUENCY: 1-2x/week  PT DURATION: 12 weeks  PLANNED INTERVENTIONS: Therapeutic exercises, Therapeutic activity, Neuromuscular re-education, Balance training, Gait training, Patient/Family education, Self Care, Joint mobilization, Dry Needling, Electrical stimulation, and Manual therapy  PLAN FOR NEXT SESSION: really work on standing, she really cannot activate the quads, work on passive stretch, she changed insurances and we will have to request visits  try w/c mobility and really help her mom with the transfers in  and out of car and would like to assure her safety if she can go to the gym   Grayce Sessions, PTA 01/16/2023, 1:52 PM

## 2023-01-24 ENCOUNTER — Ambulatory Visit: Payer: MEDICAID | Admitting: Physical Medicine and Rehabilitation

## 2023-01-30 ENCOUNTER — Ambulatory Visit: Payer: MEDICAID | Admitting: Physical Therapy

## 2023-02-07 ENCOUNTER — Ambulatory Visit: Payer: Medicaid Other | Admitting: Physical Medicine and Rehabilitation

## 2023-02-13 ENCOUNTER — Ambulatory Visit: Payer: MEDICAID | Admitting: Physical Therapy

## 2023-02-19 ENCOUNTER — Encounter: Payer: MEDICAID | Attending: Physical Medicine and Rehabilitation | Admitting: Physical Medicine and Rehabilitation

## 2023-02-19 ENCOUNTER — Encounter: Payer: Self-pay | Admitting: Physical Medicine and Rehabilitation

## 2023-02-19 VITALS — BP 134/79 | HR 98 | Ht <= 58 in

## 2023-02-19 DIAGNOSIS — M7918 Myalgia, other site: Secondary | ICD-10-CM | POA: Insufficient documentation

## 2023-02-19 DIAGNOSIS — G801 Spastic diplegic cerebral palsy: Secondary | ICD-10-CM | POA: Diagnosis present

## 2023-02-19 MED ORDER — DANTROLENE SODIUM 100 MG PO CAPS: 100.0000 mg | ORAL_CAPSULE | Freq: Two times a day (BID) | ORAL | 3 refills | Status: DC

## 2023-02-19 MED ORDER — LIDOCAINE HCL 1 % IJ SOLN
6.0000 mL | Freq: Once | INTRAMUSCULAR | Status: AC
Start: 2023-02-19 — End: 2023-02-19
  Administered 2023-02-19: 6 mL
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
## 2023-02-19 NOTE — Patient Instructions (Signed)
Plan:  .Patient here for trigger point injections for  Consent done and on chart.  Cleaned areas with alcohol and injected using a 27 gauge 1.5 inch needle  Injected  Using 1% Lidocaine with no EPI  Upper traps Levators Posterior scalenes Middle scalenes Splenius Capitus Pectoralis Major Rhomboids Infraspinatus Teres Major/minor Thoracic paraspinals Lumbar paraspinals Other injections-    Patient's level of pain prior was Current level of pain after injections is  There was no bleeding or complications.  Patient was advised to drink a lot of water on day after injections to flush system Will have increased soreness for 12-48 hours after injections.  Can use Lidocaine patches the day AFTER injections Can use theracane on day of injections in places didn't inject Can use heating pad 4-6 hours AFTER injections  2. Sent in refill of Dantrolene 100 mg BID-  180 with 3 refills.    3. F?U in 6 weeks for TRP Injections

## 2023-03-14 ENCOUNTER — Encounter: Payer: Self-pay | Admitting: Physical Medicine and Rehabilitation

## 2023-03-14 ENCOUNTER — Encounter: Payer: MEDICAID | Attending: Physical Medicine and Rehabilitation | Admitting: Physical Medicine and Rehabilitation

## 2023-03-14 VITALS — BP 138/78 | HR 85 | Ht <= 58 in

## 2023-03-14 DIAGNOSIS — G801 Spastic diplegic cerebral palsy: Secondary | ICD-10-CM | POA: Insufficient documentation

## 2023-03-14 DIAGNOSIS — M7918 Myalgia, other site: Secondary | ICD-10-CM | POA: Insufficient documentation

## 2023-03-14 DIAGNOSIS — Z993 Dependence on wheelchair: Secondary | ICD-10-CM | POA: Insufficient documentation

## 2023-03-14 MED ORDER — LIDOCAINE HCL 1 % IJ SOLN
6.0000 mL | Freq: Once | INTRAMUSCULAR | Status: AC
Start: 2023-03-14 — End: 2023-03-14
  Administered 2023-03-14: 6 mL

## 2023-03-14 NOTE — Patient Instructions (Signed)
Patient here for trigger point injections for  Consent done and on chart.  Cleaned areas with alcohol and injected using a 27 gauge 1.5 inch needle  Injected 5cc- wasted 1cc Using 1% Lidocaine with no EPI  Upper traps B/L - L only supraspinatus Levators Posterior scalenes Middle scalenes- B/L  Splenius Capitus- L only Pectoralis Major- B/L  Rhomboids- On R x4 Infraspinatus Teres Major/minor Thoracic paraspinals Lumbar paraspinals Other injections-    Patient's level of pain prior was  Current level of pain after injections is  There was no bleeding or complications.  Patient was advised to drink a lot of water on day after injections to flush system Will have increased soreness for 12-48 hours after injections.  Can use Lidocaine patches the day AFTER injections Can use theracane on day of injections in places didn't inject Can use heating pad 4-6 hours AFTER injections   2.  Suggest a cord- like a bungy cord to hold phone on chair, so cannot drop phone.    3. Went into severe spasm/spasticity- with injections for the first time today. No other Symptoms- but could be due ot overdoing/School finals-   4. F/U in 6 weeks-    5. Also discussed with pt about medicaid guidelines about w/c;s and how it really negatively affects pt and her care.

## 2023-03-14 NOTE — Progress Notes (Signed)
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 Also has myofascial pain Here for f/u on diplegic CP and also here for TrP injections for pain control.      Didn't go to bed til 1am- due to finals.  Finals going well-  Had to take incomplete- had COVID last week.  Due to fatigue- missed school time.   Fatigue is slowly getting better like her appetite.  Has until March to finish grades for 1 class- [employer] mg[employer] class-  so incomplete will last for now.     No other major issues right now.  Foot swelling- hasn't noticed since 1 week after stopped Cymbalta.  BP has come down off Cymbalta- and also quit opened toes shoes due to weather.   BP 138/78 today and HR 88.    Won't change dantrolene back to getting 90 days at a time- only 30 days at a time.   In "jury rigged" manual w/c from [employer].   Robaxin knocks her out for 8 hours and hung over 5 hours.   Plan:  Patient here for trigger point injections for  Consent done and on chart.  Cleaned areas with alcohol and injected using a 27 gauge 1.5 inch needle  Injected 5cc- wasted 1cc Using 1% Lidocaine with no EPI  Upper traps B/L - L only supraspinatus Levators Posterior scalenes Middle scalenes- B/L  Splenius Capitus- L only Pectoralis Major- B/L  Rhomboids- On R x4 Infraspinatus Teres Major/minor Thoracic paraspinals Lumbar paraspinals Other injections-    Patient's level of pain prior was  Current level of pain after injections is  There was no bleeding or complications.  Patient was advised to drink a lot of water on day after injections to flush system Will have increased soreness for 12-48 hours after injections.  Can use Lidocaine patches the day AFTER injections Can use theracane on day of injections in places didn't inject Can use heating pad 4-6 hours AFTER injections   2.  Suggest a cord- like a bungy cord to hold  phone on chair, so cannot drop phone.    3. Went into severe spasm/spasticity- with injections for the first time today. No other Symptoms- but could be due ot overdoing/School finals-   4. F/U in 6 weeks-    5. Also discussed with pt about medicaid guidelines about w/c;s and how it really negatively affects pt and her care.   I spent a total of 28   minutes on total care today- >50% coordination of care- due to  7 minutes on injections- rest discussing w/c as well as holding phone and w/c issues

## 2023-03-19 ENCOUNTER — Encounter (HOSPITAL_BASED_OUTPATIENT_CLINIC_OR_DEPARTMENT_OTHER): Payer: Self-pay

## 2023-03-19 ENCOUNTER — Ambulatory Visit (HOSPITAL_BASED_OUTPATIENT_CLINIC_OR_DEPARTMENT_OTHER): Admit: 2023-03-19 | Payer: MEDICAID | Admitting: Obstetrics and Gynecology

## 2023-03-19 SURGERY — EXAM UNDER ANESTHESIA
Anesthesia: Choice

## 2023-05-02 ENCOUNTER — Ambulatory Visit: Payer: MEDICAID | Admitting: Physical Medicine and Rehabilitation

## 2023-05-23 ENCOUNTER — Encounter: Payer: MEDICAID | Admitting: Physical Medicine and Rehabilitation

## 2023-06-02 ENCOUNTER — Other Ambulatory Visit: Payer: Self-pay | Admitting: *Deleted

## 2023-06-02 MED ORDER — PROPRANOLOL HCL 10 MG PO TABS: 10.0000 mg | ORAL_TABLET | Freq: Two times a day (BID) | ORAL | 0 refills | Status: DC

## 2023-06-02 NOTE — Telephone Encounter (Signed)
 Last seen on 11/26/22 Follow up scheduled on 06/11/23

## 2023-06-04 ENCOUNTER — Other Ambulatory Visit: Payer: Self-pay

## 2023-06-04 ENCOUNTER — Emergency Department (HOSPITAL_BASED_OUTPATIENT_CLINIC_OR_DEPARTMENT_OTHER)
Admission: EM | Admit: 2023-06-04 | Discharge: 2023-06-04 | Disposition: A | Discharge status: Home / Self Care | Payer: MEDICAID | Attending: Emergency Medicine | Admitting: Emergency Medicine

## 2023-06-04 ENCOUNTER — Encounter (HOSPITAL_BASED_OUTPATIENT_CLINIC_OR_DEPARTMENT_OTHER): Payer: Self-pay

## 2023-06-04 DIAGNOSIS — T783XXA Angioneurotic edema, initial encounter: Secondary | ICD-10-CM | POA: Diagnosis not present

## 2023-06-04 DIAGNOSIS — R220 Localized swelling, mass and lump, head: Secondary | ICD-10-CM | POA: Diagnosis present

## 2023-06-04 DIAGNOSIS — Z7982 Long term (current) use of aspirin: Secondary | ICD-10-CM | POA: Insufficient documentation

## 2023-06-04 LAB — CBC
HCT: 41.4 % (ref 36.0–46.0)
Hemoglobin: 13.9 g/dL (ref 12.0–15.0)
MCH: 29.1 pg (ref 26.0–34.0)
MCHC: 33.6 g/dL (ref 30.0–36.0)
MCV: 86.8 fL (ref 80.0–100.0)
Platelets: 267 10*3/uL (ref 150–400)
RBC: 4.77 MIL/uL (ref 3.87–5.11)
RDW: 13.2 % (ref 11.5–15.5)
WBC: 8.1 10*3/uL (ref 4.0–10.5)
nRBC: 0 % (ref 0.0–0.2)

## 2023-06-04 LAB — BASIC METABOLIC PANEL
Anion gap: 9 (ref 5–15)
BUN: 6 mg/dL (ref 6–20)
CO2: 21 mmol/L — ABNORMAL LOW (ref 22–32)
Calcium: 9.5 mg/dL (ref 8.9–10.3)
Chloride: 106 mmol/L (ref 98–111)
Creatinine, Ser: 0.56 mg/dL (ref 0.44–1.00)
GFR, Estimated: >60 mL/min (ref >60)
Glucose, Bld: 94 mg/dL (ref 70–99)
Potassium: 4 mmol/L (ref 3.5–5.1)
Sodium: 136 mmol/L (ref 135–145)

## 2023-06-04 LAB — HCG, SERUM, QUALITATIVE: Preg, Serum: NEGATIVE

## 2023-06-04 MED ORDER — FAMOTIDINE IN NACL 20-0.9 MG/50ML-% IV SOLN
20.0000 mg | Freq: Once | INTRAVENOUS | Status: AC
  Administered 2023-06-04: 20 mg via INTRAVENOUS
  Filled 2023-06-04: qty 50

## 2023-06-04 MED ORDER — DIPHENHYDRAMINE HCL 50 MG/ML IJ SOLN
25.0000 mg | Freq: Once | INTRAMUSCULAR | Status: AC
  Administered 2023-06-04: 25 mg via INTRAVENOUS
  Filled 2023-06-04: qty 1

## 2023-06-04 MED ORDER — EPINEPHRINE 0.3 MG/0.3ML IJ SOAJ: 0.3000 mg | INTRAMUSCULAR | 0 refills | Status: DC | PRN

## 2023-06-04 MED ORDER — METHYLPREDNISOLONE SODIUM SUCC 125 MG IJ SOLR
125.0000 mg | INTRAMUSCULAR | Status: AC
Start: 2023-06-04 — End: 2023-06-04
  Administered 2023-06-04: 125 mg via INTRAVENOUS
  Filled 2023-06-04: qty 2

## 2023-06-04 NOTE — ED Triage Notes (Addendum)
 Pt reports she is here today due to lip swelling. Pt reports it started around 7pm while she was in class. Pt denies any new foods,detergents,meds. Pt denies any new environment.Pt reports mild throat itching. Pt denies any cp,sob. Pt reports similar episode 3 weeks ago after starting an antibiotic for UTI.Airway intact. Pt able to speak in full and complete sentences.

## 2023-06-04 NOTE — ED Provider Notes (Signed)
 [place] EMERGENCY DEPARTMENT AT [facility] Provider Note   CSN: [id number] Arrival date & time: 06/04/23  2045     History {Add pertinent medical, surgical, social history, OB history to HPI:1} Chief Complaint  Patient presents with   Oral Swelling    Melissa Wagner is a 28 y.o. female.  28 year old female with a history of spastic diplegia cerebral palsy who presents to the emergency department lip swelling.  Patient reports that at 7 PM tonight she was in class when she started experiencing lip swelling of her upper and lower lip.  No tongue swelling.  No throat swelling.  No difficulty breathing or tolerating her secretions.  No nausea, vomiting, diarrhea, or rash.  No exposures to any known allergies.  Did take Macrobid a week and a half ago but no new medications otherwise.  Says that she is currently on escitalopram, dantrolene, Nortrel, Robaxin, Excedrin, and Tylenol.        Home Medications Prior to Admission medications   Medication Sig Start Date End Date Taking? Authorizing Provider  acetaminophen (TYLENOL) 500 MG tablet Take 500 mg by mouth every 6 (six) hours as needed.    [provider]  aspirin-acetaminophen-caffeine (EXCEDRIN MIGRAINE) [phone] MG tablet Take by mouth every 6 (six) hours as needed for headache.    [provider]  dantrolene (DANTRIUM) 100 MG capsule Take 1 capsule (100 mg total) by mouth 2 (two) times daily. For spasticity- was shorted last month- make sure not shorted- 02/19/23   Lovorn, Aundra Millet, MD  DULoxetine (CYMBALTA) 30 MG capsule Take 30 mg by mouth 2 (two) times daily. 12/04/22   [provider]  escitalopram (LEXAPRO) 10 MG tablet Take 10 mg by mouth at bedtime. 12/15/20   [provider]  methocarbamol (ROBAXIN) 500 MG tablet Take 1 tablet (500 mg total) by mouth every 6 (six) hours as needed for muscle spasms (for muscle tightness that's NOT spasticity). 07/12/22   Lovorn, Aundra Millet, MD   norethindrone-ethinyl estradiol 1/35 (ORTHO-NOVUM) tablet Take 1 tablet by mouth daily.    [provider]  promethazine (PHENERGAN) 12.5 MG tablet Take 1 tablet (12.5 mg total) by mouth every 6 (six) hours as needed for nausea or vomiting. 11/19/21   Lovorn, Aundra Millet, MD  propranolol (INDERAL) 10 MG tablet Take 1 tablet (10 mg total) by mouth 2 (two) times daily. 06/02/23   Ihor Austin, NP      Allergies    Penicillins, Amoxicillin, and Baclofen    Review of Systems   Review of Systems  Physical Exam Updated Vital Signs BP (!) 145/98 (BP Location: Left Arm)   Pulse 94   Temp 97.9 F (36.6 C)   Resp 17   SpO2 100%  Physical Exam Vitals and nursing note reviewed.  Constitutional:      General: She is not in acute distress.    Appearance: She is well-developed.  HENT:     Head: Atraumatic.     Comments: Lip swelling of the upper and lower lip.  See image below.    Right Ear: External ear normal.     Left Ear: External ear normal.     Nose: Nose normal.     Mouth/Throat:     Mouth: Mucous membranes are moist.     Pharynx: Oropharynx is clear.     Comments: No uvular swelling. Eyes:     Extraocular Movements: Extraocular movements intact.     Conjunctiva/sclera: Conjunctivae normal.     Pupils: Pupils are equal,  round, and reactive to light.  Cardiovascular:     Rate and Rhythm: Normal rate and regular rhythm.     Heart sounds: No murmur heard. Pulmonary:     Effort: Pulmonary effort is normal. No respiratory distress.     Breath sounds: Normal breath sounds. No stridor.  Musculoskeletal:     Cervical back: Normal range of motion and neck supple.     Right lower leg: No edema.     Left lower leg: No edema.  Skin:    General: Skin is warm and dry.     Findings: No rash.  Neurological:     Mental Status: She is alert and oriented to person, place, and time. Mental status is at baseline.  Psychiatric:        Mood and Affect: Mood normal.     ED Results /  Procedures / Treatments   Labs (all labs ordered are listed, but only abnormal results are displayed) Labs Reviewed  CBC  BASIC METABOLIC PANEL  HCG, SERUM, QUALITATIVE    EKG None  Radiology No results found.  Procedures Procedures  {Document cardiac monitor, telemetry assessment procedure when appropriate:1}  Medications Ordered in ED Medications  methylPREDNISolone sodium succinate (SOLU-MEDROL) 125 mg/2 mL injection 125 mg (has no administration in time range)  famotidine (PEPCID) IVPB 20 mg premix (has no administration in time range)  diphenhydrAMINE (BENADRYL) injection 25 mg (has no administration in time range)    ED Course/ Medical Decision Making/ A&P   {   Click here for ABCD2, HEART and other calculatorsREFRESH Note before signing :1}                              Medical Decision Making Amount and/or Complexity of Data Reviewed Labs: ordered.  Risk Prescription drug management.   ***  {Document critical care time when appropriate:1} {Document review of labs and clinical decision tools ie heart score, Chads2Vasc2 etc:1}  {Document your independent review of radiology images, and any outside records:1} {Document your discussion with family members, caretakers, and with consultants:1} {Document social determinants of health affecting pt's care:1} {Document your decision making why or why not admission, treatments were needed:1} Final Clinical Impression(s) / ED Diagnoses Final diagnoses:  None    Rx / DC Orders ED Discharge Orders     None

## 2023-06-04 NOTE — Discharge Instructions (Signed)
 You were seen for your angioedema in the emergency department.   At home, please use the EpiPen if you start having swelling and difficulty breathing and call 911 at that point in time.    Check your MyChart online for the results of any tests that had not resulted by the time you left the emergency department.   Follow-up with your primary doctor in 2-3 days regarding your visit.  Follow-up with an allergist to soon as possible to try and determine what caused this.  Return immediately to the emergency department if you experience any of the following: Tongue swelling, swelling in the back of your throat, difficulty breathing, or any other concerning symptoms.    Thank you for visiting our Emergency Department. It was a pleasure taking care of you today.

## 2023-06-11 ENCOUNTER — Ambulatory Visit: Payer: MEDICAID | Admitting: Adult Health

## 2023-06-13 ENCOUNTER — Encounter: Payer: Self-pay | Admitting: Physical Medicine and Rehabilitation

## 2023-06-13 ENCOUNTER — Encounter: Payer: MEDICAID | Attending: Physical Medicine and Rehabilitation | Admitting: Physical Medicine and Rehabilitation

## 2023-06-13 VITALS — BP 134/81 | HR 91 | Ht <= 58 in

## 2023-06-13 DIAGNOSIS — M7918 Myalgia, other site: Secondary | ICD-10-CM | POA: Diagnosis present

## 2023-06-13 DIAGNOSIS — G801 Spastic diplegic cerebral palsy: Secondary | ICD-10-CM | POA: Diagnosis not present

## 2023-06-13 DIAGNOSIS — R252 Cramp and spasm: Secondary | ICD-10-CM | POA: Diagnosis not present

## 2023-06-13 MED ORDER — LIDOCAINE HCL 1 % IJ SOLN
6.0000 mL | Freq: Once | INTRAMUSCULAR | Status: AC
  Administered 2023-06-13: 6 mL

## 2023-06-13 NOTE — Progress Notes (Signed)
 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 Also has myofascial pain Here for f/u on diplegic CP and also here for TrP injections for pain control.           Been crazy since December Got COVID, and them mother got COVID  Got February- no idea- got sick- respiratory virus- everything (-)- sick for 1 week and mother ended up in ED twice  Ended up in ED last week due to angioedema- lip started swelling- not clear why.   Could have been delayed reaction to ASA- from excedrin- that took 10 hours prior.   No OTC pain meds in 10 days.  Tries to avoid tylenol since put her asleep.  Though ibuprofen was related to ASA. So was also avoiding.   Going to allergist- no issues since.   Carrying around epi pen and benadryl.   Just got off phone with psychiatrist- starting Buspar  5mg  BID- hasn't actually started yet.    Doing a lot of school - recurring themes in  In academic hole-  was doing everything around house- just got to point couldn't do schoolwork- this work spring break- so digging out of hole.   House doesn't fit power w/c- only manual w/c so trying ot build up time in manual w/c- going ot gym, but neck is hurting more.     Plan: Patient here for trigger point injections for severe muscle spasms/trP's  Consent done and on chart.  Cleaned areas with alcohol and injected using a 27 gauge 1.5 inch needle  Injected 6cc-  Using 1% Lidocaine with no EPI  Upper traps B/L x3 on R Levators- R only Posterior scalenes Middle scalenes B/L  Splenius Capitus Pectoralis Major- R only Rhomboids 6x on R side and 1 on L side Infraspinatus Teres Major/minor Thoracic paraspinals Lumbar paraspinals Other injections-     There was no bleeding or complications.  Patient was advised to drink a lot of water on day after injections to flush system Will have increased soreness for 12-48 hours after  injections.  Can use Lidocaine patches the day AFTER injections Can use theracane on day of injections in places didn't inject Can use heating pad 4-6 hours AFTER injections  2. Work when pushing w/c- to keep shoulders down!   3. Still suggest a  bungee cord, etc to hold phone attached to w/c- so doesn't drop it.   4. F/U in 6 weeks for Trp injections and f/u on CP  5. Starting Buspar- just fyi most of my patients on Buspar- get up to 10-15 mg 2-3x/day.  To help with anxiety.  Suggest titrating up over time if possible.    6/ TO do Dantrolene/CMP early June.     I spent a total of  27  minutes on total care today- >50% coordination of care- due to 7 minutes on injections- rest d/w pt about buspar, anxiety and working out techniques-

## 2023-06-13 NOTE — Patient Instructions (Addendum)
 Plan: Patient here for trigger point injections for severe muscle spasms/trP's  Consent done and on chart.  Cleaned areas with alcohol and injected using a 27 gauge 1.5 inch needle  Injected 6cc-  Using 1% Lidocaine with no EPI  Upper traps B/L x3 on R Levators- R only Posterior scalenes Middle scalenes B/L  Splenius Capitus Pectoralis Major- R only Rhomboids 6x on R side and 1 on L side Infraspinatus Teres Major/minor Thoracic paraspinals Lumbar paraspinals Other injections-     There was no bleeding or complications.  Patient was advised to drink a lot of water on day after injections to flush system Will have increased soreness for 12-48 hours after injections.  Can use Lidocaine patches the day AFTER injections Can use theracane on day of injections in places didn't inject Can use heating pad 4-6 hours AFTER injections  2. Work when pushing w/c- to keep shoulders down!   3. Still suggest a  bungee cord, etc to hold phone attached to w/c- so doesn't drop it.   4. F/U in 6 weeks for Trp injections and f/u on CP  5. Starting Buspar- just fyi most of my patients on Buspar- get up to 10-15 mg 2-3x/day.  To help with anxiety.  Suggest titrating up over time if possible.   6. Will do CMP for dantrolene in June 6th

## 2023-06-15 ENCOUNTER — Encounter: Payer: Self-pay | Admitting: Adult Health

## 2023-07-15 ENCOUNTER — Ambulatory Visit: Payer: MEDICAID | Admitting: Internal Medicine

## 2023-07-15 ENCOUNTER — Encounter: Payer: Self-pay | Admitting: Internal Medicine

## 2023-07-15 VITALS — BP 136/88 | HR 90 | Temp 97.3°F | Ht <= 58 in | Wt 110.0 lb

## 2023-07-15 DIAGNOSIS — T783XXD Angioneurotic edema, subsequent encounter: Secondary | ICD-10-CM | POA: Diagnosis not present

## 2023-07-15 DIAGNOSIS — J3089 Other allergic rhinitis: Secondary | ICD-10-CM

## 2023-07-15 DIAGNOSIS — T783XXA Angioneurotic edema, initial encounter: Secondary | ICD-10-CM

## 2023-07-15 MED ORDER — FLUTICASONE PROPIONATE 50 MCG/ACT NA SUSP: 1.0000 | Freq: Every day | NASAL | 5 refills | Status: DC

## 2023-07-15 MED ORDER — CETIRIZINE HCL 5 MG/5ML PO SOLN: 5.0000 mg | Freq: Two times a day (BID) | ORAL | 5 refills | Status: DC | PRN

## 2023-07-15 NOTE — Progress Notes (Signed)
 NEW PATIENT  Date of Service/Encounter:  07/15/23  Consult requested by: [facility]   Subjective:   Melissa Wagner (DOB: June 19, 1995) is a 28 y.o. female who presents to the clinic on 07/15/2023 with a chief complaint of Advice Only (Had reaction to something, she has no hx of food allergies, she has some swelling in lips  ) .    History obtained from: chart review and patient.   Swelling: Early March, was in class and felt chapped lips and looked in the mirror and noted lip swelling of both lips. No itching/hives, trouble breathing, wheezing, diarrhea, vomiting. Went to ED and was given solumedrol/anti histamines with resolution. D/c home with Epipen and allergy follow up.  Does use Excedrin for migraines but never has had issues with it.    No new foods, no new products, no new meds  She is a Theme park manager. No prior hx of hives or swelling.  Recalls being sick about a month ago but then being very stressed as her Mom was hospitalized   Rhinitis:  Started since she was younger.  Symptoms include: nasal congestion, rhinorrhea, post nasal drainage, and sneezing, headaches  Occurs seasonally-Spring Potential triggers: not sure  Treatments tried:  Tries to avoid medications because she is overly sensitive to them.    Previous allergy testing: no History of sinus surgery: no Nonallergic triggers: none      Reviewed:  06/04/2023: seen in ED for lip swelling;  Went to ED and was given solumedrol/anti histamines with resolution. D/c home with Epipen and allergy follow up.   10/2022: positive ANA and ENA; obtained due to fatigue.  Normal ESR/CRP   05/2023: normal CBC and BMP   Past Medical History: Past Medical History:  Diagnosis Date   Anxiety    Cerebral palsy (HCC)    Dysmenorrhea     Past Surgical History: Past Surgical History:  Procedure Laterality Date   ADDUCTOR RELEASE     at age 50   dorsal rhizotomy     at age 10    HAMSTRING LENGTHENING     at the age 45    Family History: Family History  Problem Relation Age of Onset   Hypertension Mother    Hyperlipidemia Mother    Obesity Mother    Hypothyroidism Mother    Hypertension Father    Hyperlipidemia Father     Social History:  Flooring in bedroom: Engineer, civil (consulting) Pets: dogs  Tobacco use/exposure: none Job: Gaffer   Medication List:  Allergies as of 07/15/2023       Reactions   Penicillins Hives   Other Reaction(s): hives   Amoxicillin Rash   Other reaction(s): Unknown   Baclofen Nausea And Vomiting, Palpitations   Other reaction(s): Unknown N&V        Medication List        Accurate as of July 15, 2023  3:16 PM. If you have any questions, ask your nurse or doctor.          acetaminophen 500 MG tablet Commonly known as: TYLENOL Take 500 mg by mouth every 6 (six) hours as needed.   aspirin-acetaminophen-caffeine 250-250-65 MG tablet Commonly known as: EXCEDRIN MIGRAINE Take by mouth every 6 (six) hours as needed for headache.   dantrolene 100 MG capsule Commonly known as: DANTRIUM Take 1 capsule (100 mg total) by mouth 2 (two) times daily. For spasticity- was shorted last month- make sure not shorted-   EPINEPHrine 0.3 mg/0.3 mL  Soaj injection Commonly known as: EPI-PEN Inject 0.3 mg into the muscle as needed for anaphylaxis.   escitalopram 10 MG tablet Commonly known as: LEXAPRO Take 10 mg by mouth at bedtime.   methocarbamol 500 MG tablet Commonly known as: Robaxin Take 1 tablet (500 mg total) by mouth every 6 (six) hours as needed for muscle spasms (for muscle tightness that's NOT spasticity).   norethindrone-ethinyl estradiol 1/35 tablet Commonly known as: ORTHO-NOVUM Take 1 tablet by mouth daily.   promethazine 12.5 MG tablet Commonly known as: PHENERGAN Take 1 tablet (12.5 mg total) by mouth every 6 (six) hours as needed for nausea or vomiting.   propranolol 10 MG tablet Commonly known as:  INDERAL Take 1 tablet (10 mg total) by mouth 2 (two) times daily.         REVIEW OF SYSTEMS: Pertinent positives and negatives discussed in HPI.   Objective:   Physical Exam: BP 136/88   Pulse 90   Temp (!) 97.3 F (36.3 C) (Temporal)   Ht 4\' 7"  (1.397 m)   Wt 110 lb (49.9 kg) Comment: pt reported  SpO2 99%   BMI 25.57 kg/m  Body mass index is 25.57 kg/m. GEN: alert, well developed, in wheelchair HEENT: clear conjunctiva, nose with + mild inferior turbinate hypertrophy, pink nasal mucosa, + clear rhinorrhea, slight cobblestoning HEART: regular rate and rhythm, no murmur LUNGS: clear to auscultation bilaterally, no coughing, unlabored respiration ABDOMEN: soft, non distended  SKIN: no rashes or lesions  Assessment:   1. Other allergic rhinitis   2. Angioedema, initial encounter     Plan/Recommendations:  Other Allergic Rhinitis: - Due to turbinate hypertrophy, seasonal symptoms and unresponsive to over the counter meds, will perform blood testing for allergies.  - Use nasal saline rinses before nose sprays such as with Neilmed Sinus Rinse.  Use distilled water.   - Use Flonase 1 spray each nostril daily. Aim upward and outward. - Use Zyrtec 5mg  daily as needed for runny nose, sneezing, itchy watery eyes.   Angioedema (Swelling):  - Quick response to steroids and anti histamines indicates histaminergic process but we will obtain bloodwork to rule out other causes.  - At this time etiology of swelling is unknown. Hives/swelling can be caused by a variety of different triggers including illness/infection, pressure, vibrations, extremes of temperature to name a few however majority of the time there is no identifiable trigger. In about 10%, it can present with swelling only without hives.  -We will obtain labs to rule out alpha gal/mast cell/thyroid causes.  Previously with positive nonspecific ANA -If swelling recurs, use Zyrtec 5mg  twice daily.        Return in  about 3 months (around 10/14/2023).  Kristen Petri, MD [facility] 

## 2023-07-15 NOTE — Patient Instructions (Addendum)
 Other Allergic Rhinitis: - Use nasal saline rinses before nose sprays such as with Neilmed Sinus Rinse.  Use distilled water.   - Use Flonase 1 spray each nostril daily. Aim upward and outward. - Use Zyrtec 5mg  daily as needed for runny nose, sneezing, itchy watery eyes.   Angioedema (Swelling):  - At this time etiology of swelling is unknown. Hives/swelling can be caused by a variety of different triggers including illness/infection, pressure, vibrations, extremes of temperature to name a few however majority of the time there is no identifiable trigger. In about 10%, it can present with swelling only without hives.  -We will obtain labs to rule out alpha gal/mast cell/thyroid causes. -If swelling recurs, use Zyrtec 5mg  twice daily.

## 2023-07-21 ENCOUNTER — Encounter: Payer: Self-pay | Admitting: Internal Medicine

## 2023-07-21 LAB — ALLERGENS W/TOTAL IGE AREA 2

## 2023-07-21 LAB — ALPHA-GAL PANEL: IgE (Immunoglobulin E), Serum: 10 [IU]/mL (ref 6–495)

## 2023-07-21 LAB — TSH+FREE T4
Free T4: 1.12 ng/dL (ref 0.82–1.77)
TSH: 2.99 u[IU]/mL (ref 0.450–4.500)

## 2023-07-21 LAB — TRYPTASE: Tryptase: 7.6 ug/L (ref 2.2–13.2)

## 2023-07-21 LAB — C1 ESTERASE INHIBITOR, FUNCTIONAL: C1INH Functional/C1INH Total MFr SerPl: >110 %{normal}

## 2023-07-25 ENCOUNTER — Encounter: Payer: Self-pay | Admitting: Physical Medicine and Rehabilitation

## 2023-07-25 ENCOUNTER — Encounter: Payer: MEDICAID | Attending: Physical Medicine and Rehabilitation | Admitting: Physical Medicine and Rehabilitation

## 2023-07-25 VITALS — BP 129/86 | HR 80 | Ht <= 58 in | Wt 106.2 lb

## 2023-07-25 DIAGNOSIS — G801 Spastic diplegic cerebral palsy: Secondary | ICD-10-CM

## 2023-07-25 DIAGNOSIS — Z993 Dependence on wheelchair: Secondary | ICD-10-CM | POA: Diagnosis not present

## 2023-07-25 DIAGNOSIS — G808 Other cerebral palsy: Secondary | ICD-10-CM | POA: Insufficient documentation

## 2023-07-25 DIAGNOSIS — R252 Cramp and spasm: Secondary | ICD-10-CM | POA: Insufficient documentation

## 2023-07-25 DIAGNOSIS — M7918 Myalgia, other site: Secondary | ICD-10-CM | POA: Insufficient documentation

## 2023-07-25 DIAGNOSIS — M898X1 Other specified disorders of bone, shoulder: Secondary | ICD-10-CM | POA: Insufficient documentation

## 2023-07-25 MED ORDER — LIDOCAINE HCL 1 % IJ SOLN: 6.0000 mL | Freq: Once | INTRAMUSCULAR | Status: AC

## 2023-07-25 MED ORDER — DANTROLENE SODIUM 100 MG PO CAPS: 100.0000 mg | ORAL_CAPSULE | Freq: Two times a day (BID) | ORAL | 3 refills | Status: DC

## 2023-07-25 NOTE — Progress Notes (Signed)
 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 Also has myofascial pain Here for f/u on diplegic CP and also here for TrP injections for pain control.             Heading into final exams for school.  Always stressful for her and was falling asleep due to Buspar- 1 hour later after the dose.   They changed mental health meds- including Buspar- was too sedating- took off Buspar and doubled Lexapro- now on 20 mg    Only major side effect with lexapro- the GERD is horrible.  So has to take in AM- not too sedating-  Currently in "gets worse before they gets better".   Is more depressed and anxious right now.   Popping sensation when weight bears is back in L knee-  feels like patella moves a little-  Taking Dantrolene  as prescribed more- so now taking BID - still not great- 4-5x/week-  Hasn't noticed tone better- actually stable to worse. Almost like Dantrolene  wears off by end of 12 hours- around 9-10th hour-   Wasn't having that when taking 1x/day-   Saw allergist 1 week ago- all came back normal-  Doesn't have to carry Epi pen- thinks lip swelling was autoimmune response ot stress.   Takes robaxin   ~ 1-2x/month- realizes they're tight but is it tight enough- and makes her asleep for 13 hours then groggy-   Plan:  Dantrolene - Can try to back to 1x/day-  has trying to take 100 mg BID, but can always go back to 200 mg nightly.  -do for another week and if doesn't get better, go back to taking 1x/day.   2. Doesn't need refill of Robaxin - uses occ-  only when things so bad. Even 1/2 pill too sedating- but not as effectiv-e really needs 500 mg when takes it.    3. Refill Dantrolene -  taking 100 mg 2x/day- will send in for 1 year supply 3 months at a time.    4. Recheck Dantrolene  /CMP in June- needs a CMP/or CMET- can be done by PCP if need be.    5. Patient here for trigger point injections for   Consent done and on chart.  Cleaned areas with alcohol and injected using a 27 gauge 1.5 inch needle  Injected 6cc- none wasted Using 1% Lidocaine  with no EPI  Upper traps B/L x2 Levators- B/L  Posterior scalenes Middle scalenes- B/L  Splenius Capitus Pectoralis Major no Rhomboids- 5x on R- 2x on L Infraspinatus Teres Major/minor Thoracic paraspinals Lumbar paraspinals Other injections- B/L deltoids  Of note, has bruise almost right over spine- around 2 inches in diameter brown- like almost healed- more of shadow- of note- was there when saw pt- explained would NOT be from prior trP injections since over spine- don't inject in this location.   There was no bleeding or complications.  Patient was advised to drink a lot of water on day after injections to flush system Will have increased soreness for 12-48 hours after injections.  Can use Lidocaine  patches the day AFTER injections Can use theracane on day of injections in places didn't inject Can use heating pad 4-6 hours AFTER injections   6. F/U in 6 weeks- appt for Trp injections and f/u on CP/spasticity.    I spent a total of 28   minutes on total care today- >50% coordination of care- due to  Review of meds-  how to take and refills as well as trp injections and discussing medicaid and w/c's.

## 2023-07-25 NOTE — Patient Instructions (Signed)
 Plan:  Dantrolene - Can try to back to 1x/day-  has trying to take 100 mg BID, but can always go back to 200 mg nightly.  -do for another week and if doesn't get better, go back to taking 1x/day.   2. Doesn't need refill of Robaxin - uses occ-  only when things so bad. Even 1/2 pill too sedating- but not as effectiv-e really needs 500 mg when takes it.    3. Refill Dantrolene -  taking 100 mg 2x/day- will send in for 1 year supply 3 months at a time.    4. Recheck Dantrolene  /CMP in June- needs a CMP/or CMET- can be done by PCP if need be.    5. Patient here for trigger point injections for  Consent done and on chart.  Cleaned areas with alcohol and injected using a 27 gauge 1.5 inch needle  Injected 6cc- none wasted Using 1% Lidocaine  with no EPI  Upper traps B/L x2 Levators- B/L  Posterior scalenes Middle scalenes- B/L  Splenius Capitus Pectoralis Major no Rhomboids- 5x on R- 2x on L Infraspinatus Teres Major/minor Thoracic paraspinals Lumbar paraspinals Other injections- B/L deltoids  Of note, has bruise almost right over spine- around 2 inches in diameter brown- like almost healed- more of shadow- of note- was there when saw pt- explained would NOT be from prior trP injections since over spine- don't inject in this location.   There was no bleeding or complications.  Patient was advised to drink a lot of water on day after injections to flush system Will have increased soreness for 12-48 hours after injections.  Can use Lidocaine  patches the day AFTER injections Can use theracane on day of injections in places didn't inject Can use heating pad 4-6 hours AFTER injections   6. F/U in 6 weeks- appt for Trp injections and f/u on CP/spasticity.

## 2023-08-06 ENCOUNTER — Telehealth: Payer: Self-pay

## 2023-08-06 NOTE — Telephone Encounter (Signed)
 Patients trigger points was denied. Would you like to do a peer to peer or would you like to appeal it?

## 2023-09-05 ENCOUNTER — Encounter: Payer: MEDICAID | Attending: Physical Medicine and Rehabilitation | Admitting: Physical Medicine and Rehabilitation

## 2023-09-05 ENCOUNTER — Encounter: Payer: Self-pay | Admitting: Physical Medicine and Rehabilitation

## 2023-09-05 VITALS — BP 159/72 | HR 88 | Ht <= 58 in | Wt 106.2 lb

## 2023-09-05 DIAGNOSIS — R252 Cramp and spasm: Secondary | ICD-10-CM | POA: Diagnosis present

## 2023-09-05 DIAGNOSIS — G43709 Chronic migraine without aura, not intractable, without status migrainosus: Secondary | ICD-10-CM | POA: Insufficient documentation

## 2023-09-05 DIAGNOSIS — Z993 Dependence on wheelchair: Secondary | ICD-10-CM | POA: Insufficient documentation

## 2023-09-05 DIAGNOSIS — M7918 Myalgia, other site: Secondary | ICD-10-CM | POA: Insufficient documentation

## 2023-09-05 DIAGNOSIS — G801 Spastic diplegic cerebral palsy: Secondary | ICD-10-CM | POA: Insufficient documentation

## 2023-09-05 NOTE — Addendum Note (Signed)
 Addended by: Taiz Bickle W on: 09/05/2023 04:53 PM   Modules accepted: Orders

## 2023-09-05 NOTE — Progress Notes (Signed)
 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 Also has myofascial pain Here for f/u on diplegic CP and also here for TrP injections for pain control.                For migraines: Propranolol  only made her pee all the time-  Cymbalta  caused her heat intolerance- and paranoia.  Florette Hurry- couldn't afford -was $1600-  and medicaid didn't cover-  Maxalt -didn't really help the migraines.   Back taking excedrin- gets rid of headaches/migraines- since not allergic to aspirin.     Plan: Gave pt a letter that I wrote to try and get her denial of trigger point injections overturned. To try to get her appeal overturned-  cannot do injections unless she would get a bill for them, until she gets this denial overturned.    2. Don't feel doing trP injections until she gets denial overturned is appropriate-    3.  Listened to patients heart rate/rhythm for a full minute - has a regular rhythm- has a physiological change in RATE with breathing- just very slightly more evident than some people.    4. Due to being on Dantrolene - needs to get LFTs done every 6 months- is due-    5. BP 152/79- having a migraine for 3 weeks- that's probably the reason her BP is elevated. Short term, this is OK, but she's rarely had a BP elevation when she sees me.    6. Wants referral to new Neurology- but explained that most neurologists are running 12 months behind- feeling like  cannot get Migraines under control- worse in hot weather and rain/storms-   7.  If we tried to use meds for trigger points, usually use muscle relaxants, and she gets so sedated from meds, that I'm wary of doing this- d/w pt.    8. We discussed Cyproheptadine-  but she's looking more for preventative- hasn't tried topamax- but has tried Cymbalta , and they tried [place] and Maxalt  without results for rescue-    9. Next appt with me in July 18th- so not  August, which pt thought it was- less concerning.    10. F/U q 6 weeks. trP injections and f/u on CP   I spent a total of  32  minutes on total care today- >50% coordination of care- due to d/w pt about options for migraine prevention; for treatment of S'xs of trp's as well as CMP and refill of meds

## 2023-09-06 LAB — COMPREHENSIVE METABOLIC PANEL WITH GFR
ALT: 7 IU/L (ref 0–32)
AST: 16 IU/L (ref 0–40)
Albumin: 4.3 g/dL (ref 4.0–5.0)
Alkaline Phosphatase: 54 IU/L (ref 44–121)
BUN/Creatinine Ratio: 17 (ref 9–23)
BUN: 8 mg/dL (ref 6–20)
Bilirubin Total: <0.2 mg/dL (ref 0.0–1.2)
CO2: 19 mmol/L — ABNORMAL LOW (ref 20–29)
Calcium: 9.3 mg/dL (ref 8.7–10.2)
Chloride: 104 mmol/L (ref 96–106)
Creatinine, Ser: 0.47 mg/dL — ABNORMAL LOW (ref 0.57–1.00)
Globulin, Total: 2.5 g/dL (ref 1.5–4.5)
Glucose: 65 mg/dL — ABNORMAL LOW (ref 70–99)
Potassium: 4.4 mmol/L (ref 3.5–5.2)
Sodium: 139 mmol/L (ref 134–144)
Total Protein: 6.8 g/dL (ref 6.0–8.5)
eGFR: 135 mL/min/{1.73_m2} (ref >59)

## 2023-10-06 ENCOUNTER — Telehealth: Payer: Self-pay | Admitting: Physical Medicine and Rehabilitation

## 2023-10-06 NOTE — Telephone Encounter (Signed)
 Pt calling regarding a prior authorization that was sent in for dantrolene  100mg  by the [facility] pharmacy #[id number] - [place], [place] at [address] AT [employer] [address]. The pharmacy says that it was faxed twice, pt would like to make sure it was received.

## 2023-10-07 ENCOUNTER — Telehealth: Payer: Self-pay

## 2023-10-07 NOTE — Telephone Encounter (Signed)
(  Key: [facility]) PA Case ID #: [id number] Submitted Dantrolen

## 2023-10-08 NOTE — Telephone Encounter (Signed)
 approved

## 2023-10-10 ENCOUNTER — Telehealth: Payer: Self-pay | Admitting: Physical Medicine and Rehabilitation

## 2023-10-10 NOTE — Telephone Encounter (Signed)
 Melissa Wagner- [facility] called and needs to know what's the percentage of relief since last appt. Thinking something with pre auth?

## 2023-10-15 ENCOUNTER — Encounter (HOSPITAL_COMMUNITY): Payer: Self-pay | Admitting: Obstetrics and Gynecology

## 2023-10-15 NOTE — Progress Notes (Signed)
 Spoke w/ via phone for pre-op interview--- [employer]----  UPT per anesthesia. Surgeon orders requested 10/14/23.       Lab results------ COVID test -----patient states asymptomatic no test needed Arrive at -------1000 NPO after MN NO Solid Food.  Clear liquids from MN until---0900 Pre-Surgery Ensure or G2:  Med rec completed Medications to take morning of surgery ----- Dantrium , Robaxin -PRN, and Ortho-Novum. Diabetic medication -----  GLP1 agonist last dose: GLP1 instructions:  Patient instructed no nail polish to be worn day of surgery Patient instructed to bring photo id and insurance card day of surgery Patient aware to have Driver (ride ) / caregiver    for 24 hours after surgery -  Mother Agape Hardiman Patient Special Instructions ----- Shower with antibacterial soap. Pre-Op special Instructions -----  Patient verbalized understanding of instructions that were given at this phone interview. Patient denies chest pain, sob, fever, cough at the interview.

## 2023-10-17 ENCOUNTER — Encounter: Payer: MEDICAID | Attending: Physical Medicine and Rehabilitation | Admitting: Physical Medicine and Rehabilitation

## 2023-10-17 ENCOUNTER — Encounter: Payer: Self-pay | Admitting: Physical Medicine and Rehabilitation

## 2023-10-17 VITALS — BP 146/74 | HR 112 | Ht <= 58 in | Wt 106.0 lb

## 2023-10-17 DIAGNOSIS — G801 Spastic diplegic cerebral palsy: Secondary | ICD-10-CM | POA: Diagnosis not present

## 2023-10-17 DIAGNOSIS — M62838 Other muscle spasm: Secondary | ICD-10-CM | POA: Insufficient documentation

## 2023-10-17 DIAGNOSIS — M7918 Myalgia, other site: Secondary | ICD-10-CM | POA: Diagnosis present

## 2023-10-17 MED ORDER — LIDOCAINE HCL 1 % IJ SOLN
6.0000 mL | Freq: Once | INTRAMUSCULAR | Status: AC
  Administered 2023-10-17: 6 mL

## 2023-10-17 MED ORDER — METHOCARBAMOL 500 MG PO TABS: 500.0000 mg | ORAL_TABLET | Freq: Four times a day (QID) | ORAL | 3 refills | Status: AC | PRN

## 2023-10-17 NOTE — Progress Notes (Signed)
 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 Also has myofascial pain Here for f/u on diplegic CP and also here for TrP injections for pain control.                     Got approval for injections- denials overturned And 1 year approval for Dantrolene .  10/07/23- to 10/06/24.    HA's- constantly-  not always so bad, put on butt, but bad enough- rain and heat are HA triggers as well as lack of trP injections. Also has stress issues as well- family member at Rehab now at [facility].  28 yr old with multiple tumors, including brain tumors and other areas.   Next Wednesday, has pap smear under anesthesia.    Needs more Robaxin -       Plan: Needs more Methocarbamol - takes rarely- will send in 30 pills with 5 refills, so she can refill- takes WITH Dantrolene , since uses rarely.  2. Needs to go back to old Neuro-  call them to get back in-  - write meds tried- the dose and  what's been tried and what side effects you've had from each medicine tried OR that meds are too expensive and couldn't get covered with Medicaid. Be succinct!   Why are you here NOW- what made you come to doctor at this point when had problem for years . Of note, tried- Cymbalta - side effects- and Ubrelvy couldn't afford $1600- anMaxalt  didn't work.   3. Patient here for trigger point injections for  Consent done and on chart.  Cleaned areas with alcohol and injected using a 27 gauge 1.5 inch needle  Injected 6 cc- 1 cc wasted Using 1% Lidocaine  with no EPI  Upper traps B/L x2 Levators- B/L  Posterior scalenes Middle scalenes- B/L  Splenius Capitus- B/L  Pectoralis Major- B/L  Rhomboids 6x on R 2x on L Infraspinatus Teres Major/minor Thoracic paraspinals Lumbar paraspinals Other injections- L triceps    There was no bleeding or complications.  Patient was advised to drink a lot of water on day after injections  to flush system Will have increased soreness for 12-48 hours after injections.  Can use Lidocaine  patches the day AFTER injections Can use theracane on day of injections in places didn't inject Can use heating pad 4-6 hours AFTER injections  4. Con't Dantrolene - last labs look great! Has been approved by insurance for 1 year til 10/06/24.   5. F/U in 6 weeks- q 6 weeks.  For trP injections- and f/u on spasticity due to CP    I spent a total of  27  minutes on total care today- >50% coordination of care- due to 7 minutes on injections- and rest discussing her spasticity- can get worse when pain is increased- as well as prescribing robaxin - and discussing approvals from insurance.

## 2023-10-17 NOTE — Patient Instructions (Signed)
 Plan: Needs more Methocarbamol - takes rarely- will send in 30 pills with 5 refills, so she can refill- takes WITH Dantrolene , since uses rarely.  2. Needs to go back to old Neuro-  call them to get back in-  - write meds tried- the dose and  what's been tried and what side effects you've had from each medicine tried OR that meds are too expensive and couldn't get covered with Medicaid. Be succinct!   Why are you here NOW- what made you come to doctor at this point when had problem for years . Of note, tried- Cymbalta - side effects- and Ubrelvy couldn't afford $1600- anMaxalt  didn't work.   3. Patient here for trigger point injections for  Consent done and on chart.  Cleaned areas with alcohol and injected using a 27 gauge 1.5 inch needle  Injected 6 cc- 1 cc wasted Using 1% Lidocaine  with no EPI  Upper traps B/L x2 Levators- B/L  Posterior scalenes Middle scalenes- B/L  Splenius Capitus- B/L  Pectoralis Major- B/L  Rhomboids 6x on R 2x on L Infraspinatus Teres Major/minor Thoracic paraspinals Lumbar paraspinals Other injections- L triceps    There was no bleeding or complications.  Patient was advised to drink a lot of water on day after injections to flush system Will have increased soreness for 12-48 hours after injections.  Can use Lidocaine  patches the day AFTER injections Can use theracane on day of injections in places didn't inject Can use heating pad 4-6 hours AFTER injections  4. Con't Dantrolene - last labs look great! Has been approved by insurance for 1 year til 10/06/24.   5. F/U in 6 weeks- q 6 weeks.  For trP injections- and f/u on spasticity due to CP

## 2023-10-21 ENCOUNTER — Other Ambulatory Visit: Payer: Self-pay | Admitting: Obstetrics and Gynecology

## 2023-10-21 DIAGNOSIS — Z124 Encounter for screening for malignant neoplasm of cervix: Secondary | ICD-10-CM

## 2023-10-21 NOTE — H&P (Signed)
 Subjective:    Chief Complaint(s): Preop vt    HPI:      General 28 y/o presents for pre-op visit. Pt is schedule for pap smear and exam under anesthesia due to intolerance of pelvic exam . This scheduled for 10/22/2023.    Current Medication:     Taking Dantrolene  Sodium 100 MG Capsule 2 capsules Orally Once a day. Escitalopram Oxalate 20 MG Tablet 1 tablet Orally Once a day. Excedrin Migraine(Aspirin-Acetaminophen -Caffeine) 250-250-65 MG Tablet 2 tablets Orally Once a day. Methocarbamol  500 MG Tablet 1 tablet Orally once a day as needed. Nortrel 1/35 (28)(Norethindrone-Eth Estradiol) 1-35 MG-MCG Tablet TAKE 1 TABLET Orally once a day. Promethazine  HCl 12.5 MG Tablet 1 tablet as needed Orally every 6 hrs. Tylenol  Extra Strength(Acetaminophen ) 500 MG Tablet 1 tablet as needed Orally every 6 hrs. Medication List reviewed and reconciled with the patient.    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  Periods : None with OCP.  LMP Breakthrough bleeding- 10/11/2023- current spotting.  Birth control OCP.  Denies Last pap smear date.  Denies Last mammogram date.  Denies Abnormal pap smear.  Denies STD.     OB History: Never been pregnant  per patient.     Surgical History: Bilateral hamstring and adductor lengthening 08/2006 Dorsal rhizotomy( in [place] by Dr. Viviana) 2005 Multiple Botox injections Hamstring lengthening 05/2022    Hospitalization: No hospitalization in the past 12 months 06/25    Family History: Father: alive Mother:  alive MGM- breast cancer MGGM- breast cancer.    Social History:      General Tobacco use: cigarettes: Never smoked, Tobacco history last updated 10/13/2023, Vaping No.  EXPOSURE TO PASSIVE SMOKE: no.   Alcohol: no.   Recreational drug use: no.   Marital Status: single.   EDUCATION: Masters in Social Work. 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 Lives with mother.    ROS:      CONSTITUTIONAL Chills No. Fatigue No. Fever No. Night sweats No. Recent travel outside [place]  No. Sweats No. Weight change No.       OPHTHALMOLOGY Blurring of vision no. Change in vision no. Double vision no.       ENT Dizziness no. Nose bleeds no. Sore throat no. Teeth pain no.       ALLERGY Hives no.       CARDIOLOGY Chest pain no. High blood pressure no. Irregular heart beat no. Leg edema no. Palpitations no.       RESPIRATORY Shortness of breath no. Cough no. Wheezing no.       UROLOGY Pain with urination no. Urinary urgency no. Urinary frequency no. Urinary incontinence no. Difficulty urinating No. Blood in urine No.       GASTROENTEROLOGY Abdominal pain no. Appetite change no. Bloating/belching no. Blood in stool or on toilet paper no. Change in bowel movements no. Constipation no. Diarrhea no. Difficulty swallowing no. Nausea no.       FEMALE REPRODUCTIVE Vulvar pain no. Vulvar rash no. Abnormal vaginal bleeding no. Breast pain no. Nipple discharge no. Pain with intercourse no. Pelvic pain no. Unusual vaginal discharge no. Vaginal itching no.  MUSCULOSKELETAL Muscle aches no.       NEUROLOGY Headache no. Tingling/numbness no. Weakness no.       PSYCHOLOGY Depression no. Anxiety no. Nervousness no. Sleep disturbances no. Suicidal ideation no .       ENDOCRINOLOGY Excessive thirst no. Excessive urination no. Hair loss no. Heat or cold intolerance no.       HEMATOLOGY/LYMPH Abnormal bleeding no. Easy bruising no. Swollen glands no.       DERMATOLOGY New/changing skin lesion no. Rash no.  Sores no.  Negative except as stated in HPI.   Objective:    Vitals: Ht: 51, Pulse sitting: 108, BP sitting: 146/85.    Past Results:    Examination:      General Examination CONSTITUTIONAL: alert, oriented, NAD.  SKIN:  moist, warm.  EYES:  Conjunctiva clear.  LUNGS: good I:E efffort noted, clear to auscultation bilaterally.  HEART: regular rate and rhythm.  ABDOMEN: soft, non-tender/non-distended, bowel sounds present.  FEMALE GENITOURINARY:  deferred .  EXTREMITIES: no edema present.  PSYCH:  affect normal, good eye contact.     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 under anesthesia. r/b/a of the procedure were discussed. she is to avoid eating or drinking after midnight.     Nausea and vomiting in adult Refill Promethazine  HCl Tablet, 12.5 MG, 1 tablet as needed, Orally, every 6 hrs, 7 days, 28 Tablet, Refills 0     Cervical cancer screening     Notes: she is unable to tolerate pelvic exam and desires to have exam under anesthesia and pap smear for cervical cancer screening. under anesthesia. r/b/a of the procedure were discussed. she is to avoid eating or drinking after midnight.

## 2023-10-21 NOTE — H&P (Deleted)
   The note originally documented on this encounter has been moved the the encounter in which it belongs.

## 2023-10-22 ENCOUNTER — Ambulatory Visit (HOSPITAL_COMMUNITY): Payer: MEDICAID

## 2023-10-22 ENCOUNTER — Other Ambulatory Visit: Payer: Self-pay

## 2023-10-22 ENCOUNTER — Encounter (HOSPITAL_COMMUNITY): Payer: Self-pay | Admitting: Obstetrics and Gynecology

## 2023-10-22 ENCOUNTER — Ambulatory Visit (HOSPITAL_COMMUNITY)
Admission: RE | Admit: 2023-10-22 | Discharge: 2023-10-22 | Disposition: A | Discharge status: Home / Self Care | Payer: MEDICAID | Attending: Obstetrics and Gynecology | Admitting: Obstetrics and Gynecology

## 2023-10-22 ENCOUNTER — Encounter (HOSPITAL_COMMUNITY)
Admission: RE | Disposition: A | Discharge status: Home / Self Care | Payer: Self-pay | Source: Home / Self Care | Attending: Obstetrics and Gynecology

## 2023-10-22 DIAGNOSIS — F419 Anxiety disorder, unspecified: Secondary | ICD-10-CM

## 2023-10-22 DIAGNOSIS — G801 Spastic diplegic cerebral palsy: Secondary | ICD-10-CM | POA: Diagnosis present

## 2023-10-22 DIAGNOSIS — N920 Excessive and frequent menstruation with regular cycle: Secondary | ICD-10-CM | POA: Diagnosis not present

## 2023-10-22 DIAGNOSIS — Z124 Encounter for screening for malignant neoplasm of cervix: Secondary | ICD-10-CM

## 2023-10-22 DIAGNOSIS — Z01818 Encounter for other preprocedural examination: Secondary | ICD-10-CM

## 2023-10-22 HISTORY — PX: EXAM UNDER ANESTHESIA, PELVIC: SHX7461

## 2023-10-22 HISTORY — DX: Other specified postprocedural states: Z98.890

## 2023-10-22 HISTORY — DX: Personal history of urinary calculi: Z87.442

## 2023-10-22 LAB — POCT PREGNANCY, URINE: Preg Test, Ur: NEGATIVE

## 2023-10-22 SURGERY — EXAM UNDER ANESTHESIA, PELVIC
Anesthesia: General | Site: Vagina

## 2023-10-22 MED ORDER — DROPERIDOL 2.5 MG/ML IJ SOLN: 0.6250 mg | Freq: Once | INTRAMUSCULAR | Status: DC | PRN

## 2023-10-22 MED ORDER — MIDAZOLAM HCL 2 MG/2ML IJ SOLN
INTRAMUSCULAR | Status: DC | PRN
  Administered 2023-10-22: 1 mg via INTRAVENOUS

## 2023-10-22 MED ORDER — FENTANYL CITRATE (PF) 250 MCG/5ML IJ SOLN
INTRAMUSCULAR | Status: AC
  Filled 2023-10-22: qty 5

## 2023-10-22 MED ORDER — OXYCODONE HCL 5 MG PO TABS: 5.0000 mg | ORAL_TABLET | Freq: Once | ORAL | Status: DC | PRN

## 2023-10-22 MED ORDER — ONDANSETRON HCL 4 MG/2ML IJ SOLN
INTRAMUSCULAR | Status: AC
  Filled 2023-10-22: qty 2

## 2023-10-22 MED ORDER — BUPIVACAINE HCL (PF) 0.25 % IJ SOLN
INTRAMUSCULAR | Status: AC
  Filled 2023-10-22: qty 30

## 2023-10-22 MED ORDER — FENTANYL CITRATE (PF) 100 MCG/2ML IJ SOLN: 25.0000 ug | INTRAMUSCULAR | Status: DC | PRN

## 2023-10-22 MED ORDER — FENTANYL CITRATE (PF) 100 MCG/2ML IJ SOLN
INTRAMUSCULAR | Status: DC | PRN
  Administered 2023-10-22 (×2): 50 ug via INTRAVENOUS

## 2023-10-22 MED ORDER — DEXAMETHASONE SODIUM PHOSPHATE 10 MG/ML IJ SOLN
INTRAMUSCULAR | Status: AC
Start: 2023-10-22 — End: 2023-10-22
  Filled 2023-10-22: qty 1

## 2023-10-22 MED ORDER — LACTATED RINGERS IV SOLN: INTRAVENOUS | Status: DC | PRN

## 2023-10-22 MED ORDER — PROPOFOL 10 MG/ML IV BOLUS
INTRAVENOUS | Status: AC
  Filled 2023-10-22: qty 20

## 2023-10-22 MED ORDER — ONDANSETRON HCL 4 MG/2ML IJ SOLN
INTRAMUSCULAR | Status: DC | PRN
  Administered 2023-10-22: 4 mg via INTRAVENOUS

## 2023-10-22 MED ORDER — DEXAMETHASONE SODIUM PHOSPHATE 10 MG/ML IJ SOLN
INTRAMUSCULAR | Status: DC | PRN
  Administered 2023-10-22: 10 mg via INTRAVENOUS

## 2023-10-22 MED ORDER — ACETAMINOPHEN 500 MG PO TABS
ORAL_TABLET | ORAL | Status: AC
  Administered 2023-10-22: 1000 mg
  Filled 2023-10-22: qty 2

## 2023-10-22 MED ORDER — IBUPROFEN 600 MG PO TABS: 600.0000 mg | ORAL_TABLET | Freq: Four times a day (QID) | ORAL | 0 refills | Status: DC | PRN

## 2023-10-22 MED ORDER — MIDAZOLAM HCL 2 MG/2ML IJ SOLN
INTRAMUSCULAR | Status: AC
  Filled 2023-10-22: qty 2

## 2023-10-22 MED ORDER — LIDOCAINE 2% (20 MG/ML) 5 ML SYRINGE
INTRAMUSCULAR | Status: DC | PRN
  Administered 2023-10-22: 40 mg via INTRAVENOUS

## 2023-10-22 MED ORDER — LIDOCAINE 2% (20 MG/ML) 5 ML SYRINGE
INTRAMUSCULAR | Status: AC
Start: 2023-10-22 — End: 2023-10-22
  Filled 2023-10-22: qty 5

## 2023-10-22 MED ORDER — CHLORHEXIDINE GLUCONATE 0.12 % MT SOLN
OROMUCOSAL | Status: AC
  Administered 2023-10-22: 15 mL
  Filled 2023-10-22: qty 15

## 2023-10-22 MED ORDER — PROPOFOL 10 MG/ML IV BOLUS
INTRAVENOUS | Status: DC | PRN
  Administered 2023-10-22: 100 ug/kg/min via INTRAVENOUS
  Administered 2023-10-22 (×3): 50 mg via INTRAVENOUS

## 2023-10-22 MED ORDER — OXYCODONE HCL 5 MG/5ML PO SOLN: 5.0000 mg | Freq: Once | ORAL | Status: DC | PRN

## 2023-10-22 SURGICAL SUPPLY — 10 items
CATH ROBINSON RED A/P 16FR (CATHETERS) IMPLANT
GLOVE BIOGEL M 6.5 STRL (GLOVE) ×1 IMPLANT
GLOVE BIOGEL PI IND STRL 6.5 (GLOVE) ×1 IMPLANT
GLOVE BIOGEL PI IND STRL 7.0 (GLOVE) ×1 IMPLANT
GOWN STRL REUS W/ TWL LRG LVL3 (GOWN DISPOSABLE) ×1 IMPLANT
KIT TURNOVER KIT B (KITS) ×1 IMPLANT
PACK VAGINAL MINOR WOMEN LF (CUSTOM PROCEDURE TRAY) ×1 IMPLANT
PAD OB MATERNITY 11 LF (PERSONAL CARE ITEMS) ×1 IMPLANT
TOWEL GREEN STERILE FF (TOWEL DISPOSABLE) ×1 IMPLANT
UNDERPAD 30X36 HEAVY ABSORB (UNDERPADS AND DIAPERS) ×1 IMPLANT

## 2023-10-22 NOTE — H&P (Signed)
 Date of Initial H&P: 10/21/2023  History reviewed, patient examined, no change in status, stable for surgery.

## 2023-10-22 NOTE — Transfer of Care (Signed)
 Immediate Anesthesia Transfer of Care Note  Patient: Melissa Wagner  Procedure(s) Performed: EXAM UNDER ANESTHESIA, PELVIC and PAP SMEAR (Vagina )  Patient Location: PACU  Anesthesia Type:MAC  Level of Consciousness: awake  Airway & Oxygen Therapy: Patient Spontanous Breathing  Post-op Assessment: Report given to RN  Post vital signs: Reviewed and stable  Last Vitals:  Vitals Value Taken Time  BP    Temp    Pulse 89 10/22/23 12:38  Resp 17 10/22/23 12:38  SpO2 98 % 10/22/23 12:38  Vitals shown include unfiled device data.  Last Pain:  Vitals:   10/22/23 1111  TempSrc: Oral  PainSc: 2       Patients Stated Pain Goal: 7 (10/22/23 1111)  Complications: No notable events documented.

## 2023-10-22 NOTE — Anesthesia Postprocedure Evaluation (Signed)
 Anesthesia Post Note  Patient: Melissa Wagner  Procedure(s) Performed: EXAM UNDER ANESTHESIA, PELVIC and PAP SMEAR (Vagina )     Patient location during evaluation: PACU Anesthesia Type: MAC Level of consciousness: awake and alert Pain management: pain level controlled Vital Signs Assessment: post-procedure vital signs reviewed and stable Respiratory status: spontaneous breathing, nonlabored ventilation, respiratory function stable and patient connected to nasal cannula oxygen Cardiovascular status: stable and blood pressure returned to baseline Postop Assessment: no apparent nausea or vomiting Anesthetic complications: no   No notable events documented.  Last Vitals:  Vitals:   10/22/23 1111 10/22/23 1236  BP: (!) 141/95 119/71  Pulse: 93 72  Resp: 20 16  Temp: 36.8 C 36.5 C  SpO2: 99% 100%    Last Pain:  Vitals:   10/22/23 1236  TempSrc:   PainSc: 0-No pain                 Thom JONELLE Peoples

## 2023-10-22 NOTE — Discharge Instructions (Signed)
 DO NOT TAKE TYLENOL  UNTIL AFTER 6PM TODAY Post Anesthesia Home Care Instructions  Activity: Get plenty of rest for the remainder of the day. A responsible adult should stay with you for 24 hours following the procedure.  For the next 24 hours, DO NOT: -Drive a car -Advertising copywriter -Drink alcoholic beverages -Take any medication unless instructed by your physician -Make any legal decisions or sign important papers.  Meals: Start with liquid foods such as gelatin or soup. Progress to regular foods as tolerated. Avoid greasy, spicy, heavy foods. If nausea and/or vomiting occur, drink only clear liquids until the nausea and/or vomiting subsides. Call your physician if vomiting continues.  Special Instructions/Symptoms: Your throat may feel dry or sore from the anesthesia or the breathing tube placed in your throat during surgery. If this causes discomfort, gargle with warm salt water. The discomfort should disappear within 24 hours.  If you had a scopolamine patch placed behind your ear for the management of post- operative nausea and/or vomiting:  1. The medication in the patch is effective for 72 hours, after which it should be removed.  Wrap patch in a tissue and discard in the trash. Wash hands thoroughly with soap and water. 2. You may remove the patch earlier than 72 hours if you experience unpleasant side effects which may include dry mouth, dizziness or visual disturbances. 3. Avoid touching the patch. Wash your hands with soap and water after contact with the patch.  Call your surgeon if you experience:   1.  Fever over 101.0. 2.  Inability to urinate. 3.  Nausea and/or vomiting. 4.  Extreme swelling or bruising at the surgical site. 5.  Continued bleeding from the incision. 6.  Increased pain, redness or drainage from the incision. 7.  Problems related to your pain medication. 8. Any change in color, movement and/or sensation 9. Any problems and/or concerns

## 2023-10-22 NOTE — Anesthesia Preprocedure Evaluation (Addendum)
 Anesthesia Evaluation  Patient identified by MRN, date of birth, ID band Patient awake    Reviewed: Allergy & Precautions, H&P , NPO status , Patient's Chart, lab work & pertinent test results  History of Anesthesia Complications (+) PONV and history of anesthetic complications  Airway Mallampati: II  TM Distance: >3 FB Neck ROM: Full    Dental no notable dental hx.    Pulmonary neg pulmonary ROS   Pulmonary exam normal breath sounds clear to auscultation       Cardiovascular negative cardio ROS Normal cardiovascular exam Rhythm:Regular Rate:Normal     Neuro/Psych  Headaches  Anxiety     Spastic diplegic cerebral palsy  Neuromuscular disease    GI/Hepatic negative GI ROS, Neg liver ROS,,,  Endo/Other  negative endocrine ROS    Renal/GU negative Renal ROS     Musculoskeletal negative musculoskeletal ROS (+)    Abdominal   Peds negative pediatric ROS (+)  Hematology negative hematology ROS (+)   Anesthesia Other Findings   Reproductive/Obstetrics negative OB ROS                              Anesthesia Physical Anesthesia Plan  ASA: 3  Anesthesia Plan: General   Post-op Pain Management:    Induction: Intravenous  PONV Risk Score and Plan: 3 and Dexamethasone , Treatment may vary due to age or medical condition and Ondansetron   Airway Management Planned: LMA  Additional Equipment: None  Intra-op Plan:   Post-operative Plan: Extubation in OR  Informed Consent: I have reviewed the patients History and Physical, chart, labs and discussed the procedure including the risks, benefits and alternatives for the proposed anesthesia with the patient or authorized representative who has indicated his/her understanding and acceptance.     Dental advisory given  Plan Discussed with: CRNA  Anesthesia Plan Comments:          Anesthesia Quick Evaluation

## 2023-10-22 NOTE — Op Note (Signed)
 10/22/2023  12:36 PM  PATIENT:  Melissa Wagner  28 y.o. female  PRE-OPERATIVE DIAGNOSIS:  Cervical Cancer Screening Menorrhagia Spastic diplegic cerebral palsy  POST-OPERATIVE DIAGNOSIS:  Cervical Cancer ScreeningMenorrhagiaSpastic diplegic cerebral palsy  PROCEDURE:  Procedure(s): EXAM UNDER ANESTHESIA, PELVIC and PAP SMEAR (N/A)  SURGEON:  Surgeons and Role:    DEWAINE Rosalva Sawyer, MD - Primary  PHYSICIAN ASSISTANT:   ASSISTANTS: none   ANESTHESIA:   MAC  EBL:  0 mL   BLOOD ADMINISTERED:none  DRAINS: none   LOCAL MEDICATIONS USED:  NONE  SPECIMEN:  Source of Specimen:  pap smear   DISPOSITION OF SPECIMEN:  cytology  COUNTS:  YES  TOURNIQUET:  * No tourniquets in log *  DICTATION: .Note written in EPIC  PLAN OF CARE: Discharge to home after PACU  PATIENT DISPOSITION:  PACU - hemodynamically stable.   Delay start of Pharmacological VTE agent (>24hrs) due to surgical blood loss or risk of bleeding: not applicable  Findings: normal external genitalia. Intact hymen. Normal vaginal mucosa and cervix . On bimanual exam the uterus is normal size and freely mobil. No adnexal masses were appreciated.      Procedure. The patient was taken to the operative room #7 at [facility]  were a time out was performed. She was placed in the dorsal lithotomy position.  A speculum was placed in the vaginal vault.  With the findings noted above. Pap smear was performed using spatula and brush. Bimanual exam was performed. On bimanual exam the uterus is normal size and freely mobil. No adnexal masses were appreciated.   She was awakened from anesthesia and taken to the recovery room in stable condition.

## 2023-10-23 ENCOUNTER — Encounter (HOSPITAL_COMMUNITY): Payer: Self-pay | Admitting: Obstetrics and Gynecology

## 2023-10-29 LAB — CYTOLOGY - PAP
Diagnosis: NEGATIVE
Diagnosis: REACTIVE

## 2023-11-28 ENCOUNTER — Encounter: Payer: MEDICAID | Attending: Physical Medicine and Rehabilitation | Admitting: Physical Medicine and Rehabilitation

## 2023-11-28 ENCOUNTER — Encounter: Payer: Self-pay | Admitting: Physical Medicine and Rehabilitation

## 2023-11-28 VITALS — BP 136/80 | HR 98 | Ht <= 58 in

## 2023-11-28 DIAGNOSIS — G43709 Chronic migraine without aura, not intractable, without status migrainosus: Secondary | ICD-10-CM | POA: Diagnosis not present

## 2023-11-28 DIAGNOSIS — R252 Cramp and spasm: Secondary | ICD-10-CM | POA: Diagnosis present

## 2023-11-28 DIAGNOSIS — G801 Spastic diplegic cerebral palsy: Secondary | ICD-10-CM | POA: Diagnosis not present

## 2023-11-28 DIAGNOSIS — M7918 Myalgia, other site: Secondary | ICD-10-CM | POA: Diagnosis not present

## 2023-11-28 MED ORDER — LIDOCAINE HCL 1 % IJ SOLN
6.0000 mL | Freq: Once | INTRAMUSCULAR | Status: AC
  Administered 2023-11-28: 6 mL

## 2023-11-28 NOTE — Progress Notes (Signed)
 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 Also has myofascial pain Here for f/u on diplegic CP and also here for TrP injections for pain control.                    Nothing major  School started back-  MPA- paster of public affairs.   Wants trp injections.   Has had a migraine daily for 10-14 days.  Really bad HA- goes away when lays down.  Probably a migraine triggered by tension HA's.   Took a robaxin  last night-  Has been really hangover/sleepy due to robaxin  last night.  Slept great with robaxin  last night.    BP was 143/102 initially- was 138/86-   [place] grandmother died- 2 weeks after leaving CIR 2 weeks after left. The funeral was stressful.     Plan: Con't Dantrolene - - has been approved since 10/04/24  2. Last labs 09/05/23-  so due for CMP again in December/January.   3.  I don't get concerned about BP until is up in the 150s for upper number. Also discussed with BP's- electronically is more off-  than manual BP's- so don't be concerned  4. Patient here for trigger point injections for  Consent done and on chart.  Cleaned areas with alcohol and injected using a 27 gauge 1.5 inch needle  Injected 4.5cc- wasted 1.5cc Using 1% Lidocaine  with no EPI  Upper traps- B/L x2 Levators- B/L  Posterior scalenes Middle scalenes- B/L  Splenius Capitus- B/L Pectoralis Major Rhomboids- 5x on R and 2x on L Infraspinatus Teres Major/minor Thoracic paraspinals Lumbar paraspinals Other injections-   Was 6-7/10- down to 4/10  Head still hurts  There was no bleeding or complications.  Patient was advised to drink a lot of water on day after injections to flush system Will have increased soreness for 12-48 hours after injections.  Can use Lidocaine  patches the day AFTER injections Can use theracane on day of injections in places didn't inject Can use heating pad 4-6 hours AFTER  injections   5. F/U in 6 weeks for trp injections and f/u on CP- spasticity Double appt-    I spent a total of  26  minutes on total care today- >50% coordination of care- due to 6 min on trp injections- rest d/w pt about spasticity, management of grief and anxiety due to grandmother passing and BP discussion

## 2023-11-28 NOTE — Patient Instructions (Signed)
 Plan: Con't Dantrolene - - has been approved since 10/04/24  2. Last labs 09/05/23-  so due for CMP again in December/January.   3.  I don't get concerned about BP until is up in the 150s for upper number. Also discussed with BP's- electronically is more off-  than manual BP's- so don't be concerned  4. Patient here for trigger point injections for  Consent done and on chart.  Cleaned areas with alcohol and injected using a 27 gauge 1.5 inch needle  Injected 4.5cc- wasted 1.5cc Using 1% Lidocaine  with no EPI  Upper traps- B/L x2 Levators- B/L  Posterior scalenes Middle scalenes- B/L  Splenius Capitus- B/L Pectoralis Major Rhomboids- 5x on R and 2x on L Infraspinatus Teres Major/minor Thoracic paraspinals Lumbar paraspinals Other injections-   Was 6-7/10- down to 4/10  Head still hurts  There was no bleeding or complications.  Patient was advised to drink a lot of water on day after injections to flush system Will have increased soreness for 12-48 hours after injections.  Can use Lidocaine  patches the day AFTER injections Can use theracane on day of injections in places didn't inject Can use heating pad 4-6 hours AFTER injections   5. F/U in 6 weeks for trp injections and f/u on CP- spasticity Double appt-

## 2023-12-02 ENCOUNTER — Ambulatory Visit: Payer: MEDICAID | Admitting: Internal Medicine

## 2023-12-02 ENCOUNTER — Encounter: Payer: Self-pay | Admitting: Physical Medicine and Rehabilitation

## 2024-01-09 ENCOUNTER — Encounter: Payer: Self-pay | Admitting: Physical Medicine and Rehabilitation

## 2024-01-09 ENCOUNTER — Encounter: Payer: MEDICAID | Attending: Physical Medicine and Rehabilitation | Admitting: Physical Medicine and Rehabilitation

## 2024-01-09 VITALS — BP 142/86 | HR 95 | Ht <= 58 in | Wt 106.7 lb

## 2024-01-09 DIAGNOSIS — M7918 Myalgia, other site: Secondary | ICD-10-CM | POA: Diagnosis present

## 2024-01-09 DIAGNOSIS — R252 Cramp and spasm: Secondary | ICD-10-CM | POA: Insufficient documentation

## 2024-01-09 DIAGNOSIS — Z993 Dependence on wheelchair: Secondary | ICD-10-CM | POA: Diagnosis not present

## 2024-01-09 DIAGNOSIS — G801 Spastic diplegic cerebral palsy: Secondary | ICD-10-CM | POA: Diagnosis not present

## 2024-01-09 MED ORDER — LIDOCAINE HCL 1 % IJ SOLN
3.0000 mL | Freq: Once | INTRAMUSCULAR | Status: AC
  Administered 2024-01-09: 3 mL

## 2024-01-09 MED ORDER — DANTROLENE SODIUM 100 MG PO CAPS: 100.0000 mg | ORAL_CAPSULE | Freq: Two times a day (BID) | ORAL | 3 refills | Status: AC

## 2024-01-09 NOTE — Progress Notes (Signed)
 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 Also has myofascial pain Here for f/u on diplegic CP and also here for TrP injections for pain control.                         Having CP bladder issues-  Not more than usual.    BP 142/86- is worried about high BP.   Pain level not too bad-  Fatigue has been consistently bad for awhile.   Issues with Acid reflux- for awhile now  Finally got ADHD dx- needs assessment.  Therapist dx'd, but psychiatry refused to give meds for ADHD, but gave Wellbutrin for depression.   Pharmacy had filled it- but now waiting on doctor to refill since delayed picking it up.   Takes Lexapro in AM- and acid reflux sx's are worse all day now - was just a problem at night before- nothing works to treat it.   Still in school-  wants to be finished.  Done in early May 2026.       Plan:  Current administration took virtual appointments away unless psych.   2 .Needs to start with Wellbutrin- and then might decrease Lexapro.  I agree with that plan.  And take Lexapro at night!   Patient here for trigger point injections for  Consent done and on chart.  Cleaned areas with alcohol and injected using a 27 gauge 1.5 inch needle  Injected 3cc- none wasted Using 1% Lidocaine  with no EPI  Upper traps B/L  Levators L only Posterior scalenes Middle scalenes- B/L Splenius Capitus Pectoralis Major Rhomboids- 4x on R Infraspinatus Teres Major/minor Thoracic paraspinals Lumbar paraspinals Other injections- L triceps    There was no bleeding or complications.  Patient was advised to drink a lot of water on day after injections to flush system Will have increased soreness for 12-48 hours after injections.  Can use Lidocaine  patches the day AFTER injections Can use theracane on day of injections in places didn't inject Can use heating pad 4-6 hours AFTER  injections    4. Not due for labs until December -CMP  5. Con't Dantrolene - refilled for 6 months  6.  F/U in 6 weeks- trp injections and f/u on CP/spasticity.   7. Also d/w pt that BP is NOT really high- 142/85 today- will have PCP monitor.   8 Please speak with PCP about reflux.    I spent a total of  26  minutes on total care today- >50% coordination of care- due to d/w pt about her BP, GERD, and Wellbutrin,  which I think would be very helpful . Also 5 min on trp Injections

## 2024-01-09 NOTE — Patient Instructions (Signed)
 Plan:  Current administration took virtual appointments away unless psych.   2 .Needs to start with Wellbutrin- and then might decrease Lexapro.  I agree with that plan.  And take Lexapro at night!   Patient here for trigger point injections for  Consent done and on chart.  Cleaned areas with alcohol and injected using a 27 gauge 1.5 inch needle  Injected 3cc- none wasted Using 1% Lidocaine  with no EPI  Upper traps B/L  Levators L only Posterior scalenes Middle scalenes- B/L Splenius Capitus Pectoralis Major Rhomboids- 4x on R Infraspinatus Teres Major/minor Thoracic paraspinals Lumbar paraspinals Other injections- L triceps    There was no bleeding or complications.  Patient was advised to drink a lot of water on day after injections to flush system Will have increased soreness for 12-48 hours after injections.  Can use Lidocaine  patches the day AFTER injections Can use theracane on day of injections in places didn't inject Can use heating pad 4-6 hours AFTER injections    4. Not due for labs until December -CMP  5. Con't Dantrolene - refilled for 6 months  6.  F/U in 6 weeks- trp injections and f/u on CP/spasticity.

## 2024-01-29 ENCOUNTER — Encounter: Payer: Self-pay | Admitting: Physical Medicine and Rehabilitation

## 2024-03-05 ENCOUNTER — Encounter: Payer: Self-pay | Admitting: Physical Medicine and Rehabilitation

## 2024-03-05 ENCOUNTER — Encounter: Payer: MEDICAID | Attending: Physical Medicine and Rehabilitation | Admitting: Physical Medicine and Rehabilitation

## 2024-03-05 VITALS — BP 151/96 | HR 101 | Ht <= 58 in | Wt 106.0 lb

## 2024-03-05 DIAGNOSIS — G43709 Chronic migraine without aura, not intractable, without status migrainosus: Secondary | ICD-10-CM | POA: Diagnosis present

## 2024-03-05 DIAGNOSIS — R252 Cramp and spasm: Secondary | ICD-10-CM | POA: Insufficient documentation

## 2024-03-05 DIAGNOSIS — M7918 Myalgia, other site: Secondary | ICD-10-CM | POA: Diagnosis not present

## 2024-03-05 DIAGNOSIS — G808 Other cerebral palsy: Secondary | ICD-10-CM | POA: Insufficient documentation

## 2024-03-05 MED ORDER — LIDOCAINE HCL 1 % IJ SOLN
3.0000 mL | Freq: Once | INTRAMUSCULAR | Status: AC
  Administered 2024-03-05: 3 mL

## 2024-03-05 MED ORDER — SUMATRIPTAN SUCCINATE 100 MG PO TABS: 100.0000 mg | ORAL_TABLET | ORAL | 5 refills | Status: AC | PRN

## 2024-03-05 NOTE — Progress Notes (Signed)
 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 Also has myofascial pain Here for f/u on diplegic CP and also here for TrP injections for pain control.                         When went to PCP- and had elevated BP last week with no migraine-  151/96 today-  Has migraine today with current BP.   Concerned elevated BP is due to wellbutrin.  Is helping so much with other things  01/17/24- is when started it.  On for ADHD- helping a ton with chronic fatigue.  Helped with motivation-  was 3 weeks ahead during final exams and motivated to do things. Hasn't helped focus completely-   On 150 mg XL- daily- lowest dose for SR and XL  Psychiatrist wouldn't hear of non-stimulant- wouldn't accept ADHD dx.  Had documentation- but wouldn't accept it.   Both parents have elevated BP-  Cut lexapro in half taking  Wellbutrin.   Thinks spasticity has also been slightly worse L knee new pain- it's there most of time- medial tibial plateau- didn't start until hamstring lengthening- and difficulty engaging quads lately.  Cannot straighten knee out completely.     Is noticing a little more in muscle activation/mildly increase spasticity- and poor endurance- ie going to bathroom- but doesn't want to stop medicine. Spasticity has mildly abruptly gotten worse.   Also using legs, not just core to stay up in w/c.   Started PPI- can eat until full-  omeprazole 20 mg daily- and take mg glycinate with it  Exam: Awake, alert, appropriate- somewhat anxious about BP today; in power w/c today, NAD TTP over medial edge of patella right at Medial tibial plateau location-  MAS of low 3 today- throughout the range of motion- usually around a MAS of 2 ( was a major 3 before dantrolene  and hamstring surgery)  Plan: For wellbutrin due to ADHD/elevated BP- Nothing else in the world like Wellbutrin- nothing that works quite as  well for her-talk to PCP about treating elevated BP since risk/benefit ratio is that Wellbutrin doing more than complications for her personally. BP also borderline in past already per pt, and has strong family history as well.    2.  For L knee pain due to weakness of vastus medialis- Get an estim machine- $20-40 on [employer] or online-- looking to contract the muscle to help strengthen it- that should help the L knee pain. Do it on both sides. Do it at the lowest setting to make it contract the muscle.   3.  Spasticity can also be improved with Estim- so think about that if spasticity is worse.   4.  Con't Dantrolene - doesn't need refills- if things get worse, can increase dantrolene  slightly.   5 Will do CMP at next appt- since on Dantrolene .   6. Patient here for trigger point injections for  Consent done and on chart.  Cleaned areas with alcohol and injected using a 27 gauge 1.5 inch needle  Injected 3cc- none wasted Using 1% Lidocaine  with no EPI  Upper traps B/L  Levators B/L  Posterior scalenes Middle scalenes B/L and 1 extra on R Splenius Capitus Pectoralis Major B/L  (added new- due to posture) Rhomboids 5x on R and 1 on L Infraspinatus Teres Major/minor Thoracic paraspinals Lumbar paraspinals Other injections-    Patient's level of pain  prior was 7/10  Current level of pain after injections is- down to 3/10- had migraine which is better  There was no bleeding or complications.  Patient was advised to drink a lot of water on day after injections to flush system Will have increased soreness for 12-48 hours after injections.  Can use Lidocaine  patches the day AFTER injections Can use theracane on day of injections in places didn't inject Can use heating pad 4-6 hours AFTER injections  7. Doesn't take Imitrex , etc rescue for migraines- will give Imitrex  100 mg as needed- try to take within 15 minutes on headache- max 2 pills in 1 day- 2 hours apart- 10 pills/month- 5  refills- can break in half-    I spent a total of   39  minutes on total care today- >50% coordination of care- due to 6 minutes on injections- d/w pt about Spasticity- wellbutrin, BP; as well as migraines and new tx.

## 2024-03-05 NOTE — Patient Instructions (Signed)
 Plan: For wellbutrin due to ADHD/elevated BP- Nothing else in the world like Wellbutrin- nothing that works quite as well for her-talk to PCP about treating elevated BP since risk/benefit ratio is that Wellbutrin doing more than complications for her personally. BP also borderline in past already per pt, and has strong family history as well.    2.  For L knee pain due to weakness of vastus medialis- Get an estim machine- $20-40 on [employer] or online-- looking to contract the muscle to help strengthen it- that should help the L knee pain. Do it on both sides. Do it at the lowest setting to make it contract the muscle.   3.  Spasticity can also be improved with Estim- so think about that if spasticity is worse.   4.  Con't Dantrolene - doesn't need refills- if things get worse, can increase dantrolene  slightly.   5 Will do CMP at next appt- since on Dantrolene .   6. Patient here for trigger point injections for  Consent done and on chart.  Cleaned areas with alcohol and injected using a 27 gauge 1.5 inch needle  Injected 3cc- none wasted Using 1% Lidocaine  with no EPI  Upper traps B/L  Levators B/L  Posterior scalenes Middle scalenes B/L and 1 extra on R Splenius Capitus Pectoralis Major B/L  (added new- due to posture) Rhomboids 5x on R and 1 on L Infraspinatus Teres Major/minor Thoracic paraspinals Lumbar paraspinals Other injections-    Patient's level of pain prior was 7/10  Current level of pain after injections is- down to 3/10- had migraine which is better  There was no bleeding or complications.  Patient was advised to drink a lot of water on day after injections to flush system Will have increased soreness for 12-48 hours after injections.  Can use Lidocaine  patches the day AFTER injections Can use theracane on day of injections in places didn't inject Can use heating pad 4-6 hours AFTER injections  7. Doesn't take Imitrex , etc rescue for migraines- will give Imitrex   100 mg as needed- try to take within 15 minutes on headache- max 2 pills in 1 day- 2 hours apart- 10 pills/month- 5 refills- can break in half-

## 2024-04-13 ENCOUNTER — Encounter: Payer: Self-pay | Admitting: Physical Medicine and Rehabilitation

## 2024-04-23 ENCOUNTER — Ambulatory Visit: Payer: MEDICAID | Admitting: Physical Medicine and Rehabilitation

## 2024-05-17 ENCOUNTER — Encounter: Payer: MEDICAID | Admitting: Physical Medicine and Rehabilitation

## 2024-06-04 ENCOUNTER — Ambulatory Visit: Payer: MEDICAID | Admitting: Physical Medicine and Rehabilitation

## 2024-07-05 ENCOUNTER — Ambulatory Visit: Payer: MEDICAID | Admitting: Physical Medicine and Rehabilitation

## 2024-07-16 ENCOUNTER — Encounter: Payer: MEDICAID | Attending: Physical Medicine and Rehabilitation | Admitting: Physical Medicine and Rehabilitation

## 2024-08-27 ENCOUNTER — Encounter: Payer: Self-pay | Attending: Physical Medicine and Rehabilitation | Admitting: Physical Medicine and Rehabilitation

## 2024-10-08 ENCOUNTER — Encounter: Payer: Self-pay | Admitting: Physical Medicine and Rehabilitation
